# Patient Record
Sex: Male | Born: 1955 | Race: White | Hispanic: No | Smoking: Former smoker
Health system: Southern US, Community
[De-identification: ages and names within clinical notes are randomized; demographics above are authoritative.]

## PROBLEM LIST (undated history)

## (undated) DIAGNOSIS — J45909 Unspecified asthma, uncomplicated: Secondary | ICD-10-CM

## (undated) DIAGNOSIS — J449 Chronic obstructive pulmonary disease, unspecified: Secondary | ICD-10-CM

## (undated) DIAGNOSIS — I10 Essential (primary) hypertension: Secondary | ICD-10-CM

## (undated) DIAGNOSIS — E119 Type 2 diabetes mellitus without complications: Secondary | ICD-10-CM

## (undated) HISTORY — PX: KNEE SURGERY: SHX244

## (undated) HISTORY — DX: Essential (primary) hypertension: I10

## (undated) HISTORY — DX: Type 2 diabetes mellitus without complications: E11.9

## (undated) HISTORY — PX: CRANIOPLASTY: SHX1407

---

## 2018-05-11 ENCOUNTER — Encounter: Payer: Self-pay | Admitting: "Endocrinology

## 2018-07-12 ENCOUNTER — Ambulatory Visit: Payer: Self-pay | Admitting: "Endocrinology

## 2019-05-23 ENCOUNTER — Emergency Department (HOSPITAL_COMMUNITY)
Admission: EM | Admit: 2019-05-23 | Discharge: 2019-05-23 | Discharge status: Against Medical Advice | Payer: Medicaid - Out of State

## 2019-05-23 ENCOUNTER — Other Ambulatory Visit: Payer: Self-pay

## 2019-05-23 NOTE — ED Notes (Signed)
When patient came in to be triaged, he said "How much longer am I going to be here?." I stated "Well sir, I've got to find out more information about you. There will be a wait because we don't have any rooms available, but I'll get you triaged first." Stated, "I'm leaving. Have a good day."

## 2019-05-24 ENCOUNTER — Other Ambulatory Visit: Payer: Self-pay

## 2019-05-24 ENCOUNTER — Encounter (HOSPITAL_COMMUNITY): Payer: Self-pay | Admitting: Emergency Medicine

## 2019-05-24 ENCOUNTER — Emergency Department (HOSPITAL_COMMUNITY): Payer: Medicaid - Out of State

## 2019-05-24 ENCOUNTER — Emergency Department (HOSPITAL_COMMUNITY)
Admission: EM | Admit: 2019-05-24 | Discharge: 2019-05-24 | Disposition: A | Discharge status: Home / Self Care | Payer: Medicaid - Out of State | Attending: Emergency Medicine | Admitting: Emergency Medicine

## 2019-05-24 DIAGNOSIS — J4521 Mild intermittent asthma with (acute) exacerbation: Secondary | ICD-10-CM | POA: Diagnosis not present

## 2019-05-24 DIAGNOSIS — Z87891 Personal history of nicotine dependence: Secondary | ICD-10-CM | POA: Diagnosis not present

## 2019-05-24 DIAGNOSIS — Z760 Encounter for issue of repeat prescription: Secondary | ICD-10-CM | POA: Insufficient documentation

## 2019-05-24 DIAGNOSIS — R0602 Shortness of breath: Secondary | ICD-10-CM | POA: Diagnosis present

## 2019-05-24 HISTORY — DX: Unspecified asthma, uncomplicated: J45.909

## 2019-05-24 MED ORDER — PREDNISONE 50 MG PO TABS: ORAL_TABLET | ORAL | 0 refills | Status: DC

## 2019-05-24 MED ORDER — LISINOPRIL 10 MG PO TABS: 10.0000 mg | ORAL_TABLET | Freq: Every day | ORAL | 0 refills | Status: AC

## 2019-05-24 MED ORDER — AZITHROMYCIN 250 MG PO TABS: 250.0000 mg | ORAL_TABLET | Freq: Every day | ORAL | 0 refills | Status: DC

## 2019-05-24 NOTE — ED Triage Notes (Signed)
Patient states he woke up this am wheezing.  He was started on Prednisone 3 weeks ago and felt he had improvement.  States he is out of the prednisone.  Has not used inhaler this morning.

## 2019-05-24 NOTE — Discharge Instructions (Addendum)
Your chest xray suggests you may have an early lung infection and are being placed on antibiotics - take the entire course as instructed.  Also complete your 7 day course of the prednisone. You have been prescribed some lisinopril tablets so you do not run out before being able to get in with your MD for further refills.  Call for an appointment for this.  You should also get rechecked for any worsened breathing symptoms.

## 2019-05-24 NOTE — ED Provider Notes (Signed)
[facility] EMERGENCY DEPARTMENT Provider Note   CSN: [id number] Arrival date & time: 05/24/19  1012     History No chief complaint on file.   Julian Richards is a 63 y.o. male with a history of asthma, reporting increased wheezing and shortness of breath since yesterday.  His symptoms are not improved with his albuterol mdi or the neb tx he has, last dose taken yesterday.  He reports was on a course of prednisone last month which helped him greatly.  He has had a dry cough, denies chest pain, also no fevers, chills, swelling or pain in his legs.  His pcp is in [place] - states has placed him on advair which patient refuses to take, stating "I can't inhale a powder". Denies fevers, covid tested last month and negative. No known exposures.  Denies orthopnea. He reports living in an apartment and the man below him smokes, smells it through the air vents which triggers his asthma.   HPI     Past Medical History:  Diagnosis Date  . Asthma     There are no problems to display for this patient.   History reviewed. No pertinent surgical history.     No family history on file.  Social History   Tobacco Use  . Smoking status: Former Games developer  . Smokeless tobacco: Never Used  Substance Use Topics  . Alcohol use: Never  . Drug use: Never    Home Medications Prior to Admission medications   Medication Sig Start Date End Date Taking? Authorizing Provider  azithromycin (ZITHROMAX) 250 MG tablet Take 1 tablet (250 mg total) by mouth daily. Take first 2 tablets together, then 1 every day until finished. 05/24/19   Burgess Amor, PA-C  lisinopril (ZESTRIL) 10 MG tablet Take 1 tablet (10 mg total) by mouth daily. 05/24/19   Burgess Amor, PA-C  predniSONE (DELTASONE) 50 MG tablet Take one tablet daily for 7 days 05/24/19   Burgess Amor, PA-C    Allergies    Keflex [cephalexin]  Review of Systems   Review of Systems  Constitutional: Negative for chills and fever.  HENT: Negative for  congestion and sore throat.   Eyes: Negative.   Respiratory: Positive for shortness of breath and wheezing. Negative for chest tightness.   Cardiovascular: Negative for chest pain, palpitations and leg swelling.  Gastrointestinal: Negative for abdominal pain and nausea.  Genitourinary: Negative.   Musculoskeletal: Negative for arthralgias, joint swelling and neck pain.  Skin: Negative.  Negative for rash and wound.  Neurological: Negative for dizziness, weakness, light-headedness, numbness and headaches.  Psychiatric/Behavioral: Negative.     Physical Exam Updated Vital Signs BP (!) 158/83 (BP Location: Right Arm)   Pulse 76   Temp 97.9 F (36.6 C) (Oral)   Resp 18   Ht 5\' 9"  (1.753 m)   Wt 129.3 kg   SpO2 98%   BMI 42.09 kg/m   Physical Exam Vitals and nursing note reviewed.  Constitutional:      Appearance: He is well-developed.  HENT:     Head: Normocephalic and atraumatic.  Eyes:     Conjunctiva/sclera: Conjunctivae normal.  Cardiovascular:     Rate and Rhythm: Normal rate and regular rhythm.     Heart sounds: Normal heart sounds.  Pulmonary:     Effort: Pulmonary effort is normal.     Breath sounds: Examination of the right-lower field reveals decreased breath sounds and rhonchi. Decreased breath sounds and rhonchi present. No wheezing or rales.  Comments: Decreased breath sounds right base only.  No prolonged expirations. Abdominal:     General: Bowel sounds are normal.     Palpations: Abdomen is soft.     Tenderness: There is no abdominal tenderness.  Musculoskeletal:        General: Normal range of motion.     Cervical back: Normal range of motion.  Skin:    General: Skin is warm and dry.  Neurological:     Mental Status: He is alert.     ED Results / Procedures / Treatments   Labs (all labs ordered are listed, but only abnormal results are displayed) Labs Reviewed - No data to display  EKG None  Radiology DG Chest 2 View  Result Date:  05/24/2019 CLINICAL DATA:  Shortness of breath. EXAM: CHEST - 2 VIEW COMPARISON:  05/23/2019. FINDINGS: Mediastinum and hilar structures normal. Mild bibasilar atelectasis/infiltrates. No pleural effusion or pneumothorax. Cardiomegaly with normal pulmonary vascularity. Degenerative change thoracic spine. IMPRESSION: Mild bibasilar atelectasis/infiltrates. Electronically Signed   By: Marcello Moores  Register   On: 05/24/2019 12:35    Procedures Procedures (including critical care time)  Medications Ordered in ED Medications - No data to display  ED Course  I have reviewed the triage vital signs and the nursing notes.  Pertinent labs & imaging results that were available during my care of the patient were reviewed by me and considered in my medical decision making (see chart for details).    MDM Rules/Calculators/A&P                      Pt with c/o intermittent wheezing and sob, not currently here. cxr reviwed and interpreted, suggestion of infiltrates, will cover with zithromax along with 7 day prednisone pulse dosing.  Discussed the role of advair which can help prevent these episodes, make him less dependent on the nebs and mdi albuterol.  Pt was not aware this was an inhaled steroid, agrees to try it again.  Advised to start it up AFTER he has completed the prednisone course.    Pt also expressed need for refill of his lisinopril.  Has 3 days left.  Pharmacy won't fill and has been unable to get his pcp to respond for refills.  Provided #20 tab script.  Advised he needs to f/u with pcp to get further bp meds. Pt understands plan.   The patient appears reasonably screened and/or stabilized for discharge and I doubt any other medical condition or other [facility] requiring further screening, evaluation, or treatment in the ED at this time prior to discharge.  Final Clinical Impression(s) / ED Diagnoses Final diagnoses:  Mild intermittent asthma with exacerbation  Medication refill    Rx / DC  Orders ED Discharge Orders         Ordered    predniSONE (DELTASONE) 50 MG tablet     05/24/19 1305    azithromycin (ZITHROMAX) 250 MG tablet  Daily     05/24/19 1305    lisinopril (ZESTRIL) 10 MG tablet  Daily     05/24/19 1305           Evalee Jefferson, PA-C 05/24/19 1404    Noemi [place], MD 05/25/19 [phone]

## 2019-05-25 ENCOUNTER — Encounter (HOSPITAL_COMMUNITY): Payer: Self-pay

## 2019-05-25 ENCOUNTER — Other Ambulatory Visit: Payer: Self-pay

## 2019-05-25 ENCOUNTER — Emergency Department (HOSPITAL_COMMUNITY)
Admission: EM | Admit: 2019-05-25 | Discharge: 2019-05-25 | Disposition: A | Discharge status: Home / Self Care | Payer: Medicaid - Out of State | Attending: Emergency Medicine | Admitting: Emergency Medicine

## 2019-05-25 DIAGNOSIS — J45901 Unspecified asthma with (acute) exacerbation: Secondary | ICD-10-CM | POA: Insufficient documentation

## 2019-05-25 DIAGNOSIS — Z87891 Personal history of nicotine dependence: Secondary | ICD-10-CM | POA: Diagnosis not present

## 2019-05-25 DIAGNOSIS — R079 Chest pain, unspecified: Secondary | ICD-10-CM | POA: Diagnosis present

## 2019-05-25 NOTE — ED Provider Notes (Signed)
[facility] EMERGENCY DEPARTMENT Provider Note   CSN: [id number] Arrival date & time: 05/25/19  2251     History Chief Complaint  Patient presents with  . Recheck chest    Julian Richards is a 63 y.o. male.  HPI Patient presents for symptoms similar to when diagnosed with asthma exacerbation, yesterday.  He is currently taking Zithromax and prednisone.  Is also using albuterol inhaler.  He denies fever, chills, nausea, vomiting weakness or dizziness.  He has some mild left anterior chest pain.  He does not have productive cough.  He denies generalized achiness.  There are no other known modifying factors.    Past Medical History:  Diagnosis Date  . Asthma     There are no problems to display for this patient.   History reviewed. No pertinent surgical history.     No family history on file.  Social History   Tobacco Use  . Smoking status: Former Research scientist (life sciences)  . Smokeless tobacco: Never Used  Substance Use Topics  . Alcohol use: Never  . Drug use: Never    Home Medications Prior to Admission medications   Medication Sig Start Date End Date Taking? Authorizing Provider  azithromycin (ZITHROMAX) 250 MG tablet Take 1 tablet (250 mg total) by mouth daily. Take first 2 tablets together, then 1 every day until finished. 05/24/19   Evalee Jefferson, PA-C  lisinopril (ZESTRIL) 10 MG tablet Take 1 tablet (10 mg total) by mouth daily. 05/24/19   Evalee Jefferson, PA-C  predniSONE (DELTASONE) 50 MG tablet Take one tablet daily for 7 days 05/24/19   Evalee Jefferson, PA-C    Allergies    Keflex [cephalexin]  Review of Systems   Review of Systems  All other systems reviewed and are negative.   Physical Exam Updated Vital Signs BP (!) 152/81 (BP Location: Right Arm)   Pulse 85   Resp 17   SpO2 99%   Physical Exam Vitals and nursing note reviewed.  Constitutional:      General: He is not in acute distress.    Appearance: He is well-developed. He is not ill-appearing, toxic-appearing  or diaphoretic.  HENT:     Head: Normocephalic and atraumatic.     Right Ear: External ear normal.     Left Ear: External ear normal.  Eyes:     Conjunctiva/sclera: Conjunctivae normal.     Pupils: Pupils are equal, round, and reactive to light.  Neck:     Trachea: Phonation normal.  Cardiovascular:     Rate and Rhythm: Normal rate and regular rhythm.     Heart sounds: Normal heart sounds.  Pulmonary:     Effort: Pulmonary effort is normal. No respiratory distress.     Breath sounds: Normal breath sounds. No stridor. No rhonchi.  Musculoskeletal:        General: Normal range of motion.     Cervical back: Normal range of motion and neck supple.  Skin:    General: Skin is warm and dry.  Neurological:     Mental Status: He is alert and oriented to person, place, and time.     Cranial Nerves: No cranial nerve deficit.     Sensory: No sensory deficit.     Motor: No abnormal muscle tone.     Coordination: Coordination normal.  Psychiatric:        Mood and Affect: Mood normal.        Behavior: Behavior normal.        Thought Content: Thought  content normal.        Judgment: Judgment normal.     ED Results / Procedures / Treatments   Labs (all labs ordered are listed, but only abnormal results are displayed) Labs Reviewed - No data to display  EKG None  Radiology DG Chest 2 View  Result Date: 05/24/2019 CLINICAL DATA:  Shortness of breath. EXAM: CHEST - 2 VIEW COMPARISON:  05/23/2019. FINDINGS: Mediastinum and hilar structures normal. Mild bibasilar atelectasis/infiltrates. No pleural effusion or pneumothorax. Cardiomegaly with normal pulmonary vascularity. Degenerative change thoracic spine. IMPRESSION: Mild bibasilar atelectasis/infiltrates. Electronically Signed   By: Maisie Fus  Register   On: 05/24/2019 12:35    Procedures Procedures (including critical care time)  Medications Ordered in ED Medications - No data to display  ED Course  I have reviewed the triage vital  signs and the nursing notes.  Pertinent labs & imaging results that were available during my care of the patient were reviewed by me and considered in my medical decision making (see chart for details).    MDM Rules/Calculators/A&P                       Patient Vitals for the past 24 hrs:  BP Pulse Resp SpO2  05/25/19 2304 (!) 152/81 85 17 99 %    11:19 PM Reevaluation with update and discussion. After initial assessment and treatment, an updated evaluation reveals no change in clinical status, findings discussed with patient and all questions answered. Mancel Bale   Medical Decision Making: Evaluation consistent with mild to moderate asthma exacerbation.  Possible infection, nonspecific infiltrates on imaging from yesterday noted.  I reviewed the films.  Today vital signs are normal, with the exception of mild blood pressure elevation..  Note that the patient refused to have his temperature taken.  Doubt serious bacterial infection, impending respiratory decline or metabolic instability.  IDEN STRIPLING was evaluated in Emergency Department on 05/25/2019 for the symptoms described in the history of present illness. He was evaluated in the context of the global COVID-19 pandemic, which necessitated consideration that the patient might be at risk for infection with the SARS-CoV-2 virus that causes COVID-19. Institutional protocols and algorithms that pertain to the evaluation of patients at risk for COVID-19 are in a state of rapid change based on information released by regulatory bodies including the CDC and federal and state organizations. These policies and algorithms were followed during the patient's care in the ED.   CRITICAL CARE- no Performed by: Mancel Bale   Nursing Notes Reviewed/ Care Coordinated Applicable Imaging Reviewed Interpretation of Laboratory Data incorporated into ED treatment  The patient appears reasonably screened and/or stabilized for discharge and I doubt  any other medical condition or other [facility] requiring further screening, evaluation, or treatment in the ED at this time prior to discharge.  Plan: Home Medications-continue current prescribed medicine use Tylenol or Motrin for fever or pain; Home Treatments-rest, fluids; return here if the recommended treatment, does not improve the symptoms; Recommended follow up-PCP, prn    Final Clinical Impression(s) / ED Diagnoses Final diagnoses:  Moderate asthma with exacerbation, unspecified whether persistent    Rx / DC Orders ED Discharge Orders    None       Mancel Bale, MD 05/25/19 2321

## 2019-05-25 NOTE — Discharge Instructions (Addendum)
Continue taking the prescribed medications.

## 2019-05-25 NOTE — ED Triage Notes (Signed)
Pt was seen here a couple of days ago for some discomfort in his sternum, states was started on antibiotics for same but does not feel any better, has only taken 2 of the 6 pills so far.

## 2019-05-28 ENCOUNTER — Encounter (HOSPITAL_COMMUNITY): Payer: Self-pay

## 2019-05-28 ENCOUNTER — Emergency Department (HOSPITAL_COMMUNITY)
Admission: EM | Admit: 2019-05-28 | Discharge: 2019-05-28 | Disposition: A | Discharge status: Home / Self Care | Payer: Medicaid - Out of State | Attending: Emergency Medicine | Admitting: Emergency Medicine

## 2019-05-28 ENCOUNTER — Other Ambulatory Visit: Payer: Self-pay

## 2019-05-28 ENCOUNTER — Emergency Department (HOSPITAL_COMMUNITY): Payer: Medicaid - Out of State

## 2019-05-28 DIAGNOSIS — Z5321 Procedure and treatment not carried out due to patient leaving prior to being seen by health care provider: Secondary | ICD-10-CM

## 2019-05-28 DIAGNOSIS — R0602 Shortness of breath: Secondary | ICD-10-CM | POA: Diagnosis present

## 2019-05-28 NOTE — ED Triage Notes (Signed)
Pt presents to ED with complaints of increased SOB since this morning. Pt states he was seen here last week with the same thing. Pt states he completed his antibiotics and steroids. Pt denies fever.

## 2019-05-29 ENCOUNTER — Other Ambulatory Visit: Payer: Self-pay

## 2019-05-29 ENCOUNTER — Emergency Department (HOSPITAL_COMMUNITY): Payer: Medicaid - Out of State

## 2019-05-29 ENCOUNTER — Emergency Department (HOSPITAL_COMMUNITY)
Admission: EM | Admit: 2019-05-29 | Discharge: 2019-05-30 | Disposition: A | Discharge status: Home / Self Care | Payer: Medicaid - Out of State | Attending: Emergency Medicine | Admitting: Emergency Medicine

## 2019-05-29 ENCOUNTER — Encounter (HOSPITAL_COMMUNITY): Payer: Self-pay | Admitting: Emergency Medicine

## 2019-05-29 DIAGNOSIS — Z87891 Personal history of nicotine dependence: Secondary | ICD-10-CM | POA: Insufficient documentation

## 2019-05-29 DIAGNOSIS — R0602 Shortness of breath: Secondary | ICD-10-CM | POA: Diagnosis present

## 2019-05-29 DIAGNOSIS — Z7984 Long term (current) use of oral hypoglycemic drugs: Secondary | ICD-10-CM | POA: Diagnosis not present

## 2019-05-29 DIAGNOSIS — Z79899 Other long term (current) drug therapy: Secondary | ICD-10-CM | POA: Insufficient documentation

## 2019-05-29 DIAGNOSIS — E119 Type 2 diabetes mellitus without complications: Secondary | ICD-10-CM | POA: Insufficient documentation

## 2019-05-29 DIAGNOSIS — J4521 Mild intermittent asthma with (acute) exacerbation: Secondary | ICD-10-CM | POA: Insufficient documentation

## 2019-05-29 LAB — CBG MONITORING, ED
Glucose-Capillary: 163 mg/dL — ABNORMAL HIGH (ref 70–99)
Glucose-Capillary: 201 mg/dL — ABNORMAL HIGH (ref 70–99)

## 2019-05-29 MED ORDER — ALBUTEROL SULFATE (2.5 MG/3ML) 0.083% IN NEBU: 5.0000 mg | INHALATION_SOLUTION | Freq: Once | RESPIRATORY_TRACT | Status: DC

## 2019-05-29 MED ORDER — BUDESONIDE-FORMOTEROL FUMARATE 80-4.5 MCG/ACT IN AERO: 2.0000 | INHALATION_SPRAY | Freq: Two times a day (BID) | RESPIRATORY_TRACT | 0 refills | Status: DC

## 2019-05-29 MED ORDER — SITAGLIPTIN PHOSPHATE 100 MG PO TABS: 100.0000 mg | ORAL_TABLET | Freq: Every day | ORAL | 0 refills | Status: AC

## 2019-05-29 MED ORDER — PREDNISONE 10 MG PO TABS: ORAL_TABLET | ORAL | 0 refills | Status: DC

## 2019-05-29 NOTE — ED Provider Notes (Signed)
[facility] EMERGENCY DEPARTMENT Provider Note   CSN: [id number] Arrival date & time: 05/29/19  2039   History Chief Complaint  Patient presents with  . Shortness of Breath    Julian Richards is a 63 y.o. male.  The history is provided by the patient.  Shortness of Breath He has history of asthma and was recently seen in emergency and prescribed azithromycin and prednisone and comes in stating that he has run out of those and he wants a refill.  He did have some slight dyspnea earlier today, but is not having any dyspnea currently.  He denies cough or fever.  He denies change in sense of smell or taste.  He is also requesting a refill on his prescription for Januvia stating that the pharmacy will not refill it and he has had difficulty getting his PCP to authorize refills.  Past Medical History:  Diagnosis Date  . Asthma     There are no problems to display for this patient.   History reviewed. No pertinent surgical history.     No family history on file.  Social History   Tobacco Use  . Smoking status: Former Research scientist (life sciences)  . Smokeless tobacco: Never Used  Substance Use Topics  . Alcohol use: Never  . Drug use: Never    Home Medications Prior to Admission medications   Medication Sig Start Date End Date Taking? Authorizing Provider  azithromycin (ZITHROMAX) 250 MG tablet Take 1 tablet (250 mg total) by mouth daily. Take first 2 tablets together, then 1 every day until finished. 05/24/19   Evalee Jefferson, PA-C  lisinopril (ZESTRIL) 10 MG tablet Take 1 tablet (10 mg total) by mouth daily. 05/24/19   Evalee Jefferson, PA-C  predniSONE (DELTASONE) 50 MG tablet Take one tablet daily for 7 days 05/24/19   Evalee Jefferson, PA-C    Allergies    Keflex [cephalexin]  Review of Systems   Review of Systems  Respiratory: Positive for shortness of breath.   All other systems reviewed and are negative.   Physical Exam Updated Vital Signs BP 114/64 (BP Location: Right Arm)   Pulse 91    Temp 98.1 F (36.7 C) (Oral)   Ht 5\' 9"  (1.753 m)   Wt 129.3 kg   SpO2 98%   BMI 42.09 kg/m   Physical Exam Vitals and nursing note reviewed.   Obese 63 year old male, resting comfortably and in no acute distress. Vital signs are normal. Oxygen saturation is 98%, which is normal. Head is normocephalic and atraumatic. PERRLA, EOMI. Oropharynx is clear. Neck is nontender and supple without adenopathy or JVD. Back is nontender and there is no CVA tenderness. Lungs are clear without rales, wheezes, or rhonchi. Chest is nontender. Heart has regular rate and rhythm without murmur. Abdomen is soft, flat, nontender without masses or hepatosplenomegaly and peristalsis is normoactive. Extremities have 1+ edema, full range of motion is present. Skin is warm and dry without rash. Neurologic: Mental status is normal, cranial nerves are intact, there are no motor or sensory deficits.  ED Results / Procedures / Treatments   Labs (all labs ordered are listed, but only abnormal results are displayed) Labs Reviewed  CBG MONITORING, ED - Abnormal; Notable for the following components:      Result Value   Glucose-Capillary 201 (*)    All other components within normal limits  CBG MONITORING, ED   Radiology DG Chest 2 View  Result Date: 05/28/2019 CLINICAL DATA:  Shortness of breath. EXAM: CHEST -  2 VIEW COMPARISON:  May 24, 2019 FINDINGS: The mediastinal contour and cardiac silhouette are normal. Mild atelectasis of both lung bases are noted. There is no focal pneumonia, pulmonary edema, or pleural effusion. The bony structures are normal. IMPRESSION: Mild atelectasis of both lung bases. No focal pneumonia or pulmonary edema. Electronically Signed   By: Sherian Rein M.D.   On: 05/28/2019 12:23    Procedures Procedures  Medications Ordered in ED Medications  albuterol (PROVENTIL) (2.5 MG/3ML) 0.083% nebulizer solution 5 mg (5 mg Nebulization Not Given 05/29/19 2224)    ED Course  I  have reviewed the triage vital signs and the nursing notes.  Pertinent labs & imaging results that were available during my care of the patient were reviewed by me and considered in my medical decision making (see chart for details).    MDM Rules/Calculators/A&P Asthma, diabetes.  Old records reviewed confirming recent ED visit for asthma exacerbation.  He also had been seen at [facility] emergency department at which location he stated that he had been prescribed Advair but does not like to use it because he has difficulty inhaling a powder.  His glucose is to significantly elevated.  Chest x-ray shows no evidence of pneumonia.  He is given a 1 month supply of his Januvia and I will put him on a relatively short steroid taper.  He is given a prescription for Symbicort which is an inhaled aerosol steroid and long-acting beta agonist.  I do not see an indication for refilling the antibiotics.  He is referred back to his PCP.  Julian Richards was evaluated in Emergency Department on 05/29/2019 for the symptoms described in the history of present illness. He was evaluated in the context of the global COVID-19 pandemic, which necessitated consideration that the patient might be at risk for infection with the SARS-CoV-2 virus that causes COVID-19. Institutional protocols and algorithms that pertain to the evaluation of patients at risk for COVID-19 are in a state of rapid change based on information released by regulatory bodies including the CDC and federal and state organizations. These policies and algorithms were followed during the patient's care in the ED.  Final Clinical Impression(s) / ED Diagnoses Final diagnoses:  Mild intermittent asthma with exacerbation  Controlled type 2 diabetes mellitus without complication, without long-term current use of insulin (HCC)    Rx / DC Orders ED Discharge Orders         Ordered    sitaGLIPtin (JANUVIA) 100 MG tablet  Daily     05/29/19 2331     budesonide-formoterol (SYMBICORT) 80-4.5 MCG/ACT inhaler  2 times daily     05/29/19 2336    predniSONE (DELTASONE) 10 MG tablet     05/29/19 2342           Dione Booze, MD 05/30/19 0006

## 2019-05-29 NOTE — Discharge Instructions (Addendum)
You have been given a prescription for Symbicort which is an inhaled steroid plus a long-acting bronchodilator.  Please use this as directed.  You have been given a 1 month prescription for Januvia.  You will need to see your primary care provider to get additional prescriptions for this.  Continue to use your albuterol inhaler as needed.  Take the prednisone exactly as prescribed.  You will be taking a gradually decreasing dose over the next week..  Please monitor your blood sugars at home.

## 2019-05-29 NOTE — ED Triage Notes (Signed)
Pt c/o sob and dizziness that started today. Pt states he has been out of his meds x one week.

## 2019-05-31 ENCOUNTER — Other Ambulatory Visit: Payer: Self-pay

## 2019-05-31 ENCOUNTER — Encounter (HOSPITAL_COMMUNITY): Payer: Self-pay | Admitting: Emergency Medicine

## 2019-05-31 ENCOUNTER — Emergency Department (HOSPITAL_COMMUNITY)
Admission: EM | Admit: 2019-05-31 | Discharge: 2019-05-31 | Disposition: A | Discharge status: Home / Self Care | Payer: Medicaid - Out of State | Attending: Emergency Medicine | Admitting: Emergency Medicine

## 2019-05-31 DIAGNOSIS — R42 Dizziness and giddiness: Secondary | ICD-10-CM | POA: Insufficient documentation

## 2019-05-31 NOTE — ED Triage Notes (Signed)
Patient left without being seen after Triage.

## 2019-05-31 NOTE — ED Triage Notes (Signed)
Patient states he still feels dizzy, was seen here 12/28 for same. Patient denies nausea or vomiting. Patient states he feels "like he is going to fall sometimes when he stands up."

## 2019-06-01 ENCOUNTER — Emergency Department (HOSPITAL_COMMUNITY)
Admission: EM | Admit: 2019-06-01 | Discharge: 2019-06-01 | Discharge status: Against Medical Advice | Payer: Medicaid - Out of State

## 2019-06-01 NOTE — ED Notes (Signed)
Patient left before triage. Patient left without being seen day before for same.

## 2019-06-05 NOTE — ED Provider Notes (Signed)
Addendum for ED visit 05/29/2019:  LOGON, UTTECH YI:[id number] 29-May-2019 21:44:33 [facility]-ED ROUTINE RECORD Normal sinus rhythm Cannot rule out Inferior infarct , age undetermined Abnormal ECG No significant change since last tracing Confirmed by Linwood Dibbles [id number]) on 05/31/2019 10:16:54 AM 38mm/s 59mm/mV 100Hz  9.0.4 12SL 241 HD CID: [id number] Referred by: BRIAN MILLER Confirmed By: 76 Vent. rate 91 BPM PR interval 136 ms QRS duration 102 ms QT/QTc 384/472 ms P-R-T axes 60 34 -36 05-17-1956 (63 yr) Male Caucasian Room: Loc:906 Technician: kab,rn Test ind:sob   20-Feb-1956, MD 06/05/19 2315

## 2019-06-09 ENCOUNTER — Other Ambulatory Visit: Payer: Medicaid - Out of State

## 2019-06-25 ENCOUNTER — Encounter (HOSPITAL_COMMUNITY): Payer: Self-pay | Admitting: *Deleted

## 2019-06-25 ENCOUNTER — Other Ambulatory Visit: Payer: Self-pay

## 2019-06-25 ENCOUNTER — Emergency Department (HOSPITAL_COMMUNITY)
Admission: EM | Admit: 2019-06-25 | Discharge: 2019-06-25 | Disposition: A | Discharge status: Home / Self Care | Payer: Medicaid - Out of State | Attending: Emergency Medicine | Admitting: Emergency Medicine

## 2019-06-25 DIAGNOSIS — J45909 Unspecified asthma, uncomplicated: Secondary | ICD-10-CM | POA: Insufficient documentation

## 2019-06-25 DIAGNOSIS — Z87891 Personal history of nicotine dependence: Secondary | ICD-10-CM | POA: Diagnosis not present

## 2019-06-25 DIAGNOSIS — R519 Headache, unspecified: Secondary | ICD-10-CM | POA: Diagnosis not present

## 2019-06-25 DIAGNOSIS — M79672 Pain in left foot: Secondary | ICD-10-CM | POA: Diagnosis not present

## 2019-06-25 DIAGNOSIS — J0111 Acute recurrent frontal sinusitis: Secondary | ICD-10-CM | POA: Diagnosis not present

## 2019-06-25 DIAGNOSIS — Z79899 Other long term (current) drug therapy: Secondary | ICD-10-CM | POA: Insufficient documentation

## 2019-06-25 DIAGNOSIS — R0981 Nasal congestion: Secondary | ICD-10-CM | POA: Diagnosis present

## 2019-06-25 MED ORDER — PREDNISONE 20 MG PO TABS: 40.0000 mg | ORAL_TABLET | Freq: Every day | ORAL | 0 refills | Status: DC

## 2019-06-25 MED ORDER — PREDNISONE 20 MG PO TABS
40.0000 mg | ORAL_TABLET | Freq: Once | ORAL | Status: AC
  Administered 2019-06-25: 23:00:00 40 mg via ORAL
  Filled 2019-06-25: qty 2

## 2019-06-25 NOTE — ED Triage Notes (Signed)
Pt with sinus infection 4 months ago, facial pain like before for past 2 days.  Gout pain in left foot.

## 2019-06-25 NOTE — ED Provider Notes (Signed)
[facility] EMERGENCY DEPARTMENT Provider Note   CSN: [id number] Arrival date & time: 06/25/19  2125     History Chief Complaint  Patient presents with  . Facial Pain    Julian Richards is a 64 y.o. male.  HPI      Julian Richards is a 64 y.o. male who presents to the Emergency Department complaining of nasal congestion, sinus pressure and pain.  Symptoms have been present for 2 days.  He reports history of frequent sinus infections with last episode approximately 4 months ago.  He states current symptoms feel similar to previous.  He describes throbbing pain and pressure across his forehead and nose.  Pain is worse with bending over.  He has not taken any medications for symptomatic relief.  He also complains of pain and swelling of the toes of his left foot.  Reports history of gout and has been taking colchicine without relief.  He describes throbbing pain to his toes that is nonradiating.  He denies fever, chills, trauma,  numbness of his toes or foot or redness. He also denies headache, neck pain or stiffness, visual changes or dizziness.    Past Medical History:  Diagnosis Date  . Asthma     There are no problems to display for this patient.   History reviewed. No pertinent surgical history.     Family History  Problem Relation Age of Onset  . Stroke Father   . Cancer Sister   . Stroke Other     Social History   Tobacco Use  . Smoking status: Former Research scientist (life sciences)  . Smokeless tobacco: Never Used  Substance Use Topics  . Alcohol use: Never  . Drug use: Never    Home Medications Prior to Admission medications   Medication Sig Start Date End Date Taking? Authorizing Provider  budesonide-formoterol (SYMBICORT) 80-4.5 MCG/ACT inhaler Inhale 2 puffs into the lungs 2 (two) times daily. 01/77/93   Delora Fuel, MD  lisinopril (ZESTRIL) 10 MG tablet Take 1 tablet (10 mg total) by mouth daily. 05/24/19   Evalee Jefferson, PA-C  predniSONE (DELTASONE) 10 MG tablet Four tablets a  day for two days, then three tablets a day for two days, then two tablets a day for two days, then onoe tablet a day for two days 90/30/09   Delora Fuel, MD  sitaGLIPtin (JANUVIA) 100 MG tablet Take 1 tablet (100 mg total) by mouth daily. 23/30/07   Delora Fuel, MD    Allergies    Keflex [cephalexin]  Review of Systems   Review of Systems  Constitutional: Negative for chills and fever.  HENT: Positive for sinus pressure and sinus pain. Negative for sore throat and trouble swallowing.   Respiratory: Negative for cough, chest tightness, shortness of breath and wheezing.   Cardiovascular: Negative for chest pain and leg swelling.  Gastrointestinal: Negative for nausea and vomiting.  Genitourinary: Negative for difficulty urinating and dysuria.  Musculoskeletal: Positive for arthralgias (left foot pain). Negative for joint swelling.  Skin: Negative for color change and wound.  Neurological: Negative for dizziness, weakness, numbness and headaches.    Physical Exam Updated Vital Signs BP (!) 151/66 (BP Location: Right Arm)   Pulse 90   Temp (!) 97.5 F (36.4 C) (Oral)   Resp 16   Ht 5\' 9"  (1.753 m)   Wt 124.7 kg   SpO2 98%   BMI 40.61 kg/m   Physical Exam Vitals and nursing note reviewed.  Constitutional:      Appearance: Normal  appearance.  HENT:     Head: Normocephalic.     Right Ear: Tympanic membrane and ear canal normal.     Left Ear: Tympanic membrane and ear canal normal.     Nose:     Right Turbinates: Swollen.     Left Turbinates: Swollen.     Right Sinus: Maxillary sinus tenderness present.     Left Sinus: Maxillary sinus tenderness present.     Mouth/Throat:     Mouth: Mucous membranes are moist.     Pharynx: Oropharynx is clear. Uvula midline.  Cardiovascular:     Rate and Rhythm: Normal rate and regular rhythm.     Pulses: Normal pulses.  Pulmonary:     Effort: Pulmonary effort is normal. No respiratory distress.     Breath sounds: Normal breath sounds.  No wheezing.  Chest:     Chest wall: No tenderness.  Musculoskeletal:        General: Tenderness present. No signs of injury. Normal range of motion.     Cervical back: Normal range of motion. No rigidity or tenderness.     Comments: Pt with ttp of the second and great toe of the left foot.  Mild erythema noted on exam without edema.  No open wounds/sores to the foot or web spaces of the toes  Lymphadenopathy:     Cervical: No cervical adenopathy.  Skin:    General: Skin is warm.     Capillary Refill: Capillary refill takes less than 2 seconds.     Findings: No bruising, erythema or rash.  Neurological:     General: No focal deficit present.     Mental Status: He is alert.     Sensory: Sensation is intact. No sensory deficit.     Motor: Motor function is intact. No weakness.     Coordination: Coordination is intact.     Comments: CN II-XII grossly intact.  Speech clear.       ED Results / Procedures / Treatments   Labs (all labs ordered are listed, but only abnormal results are displayed) Labs Reviewed - No data to display  EKG None  Radiology No results found.  Procedures Procedures (including critical care time)  Medications Ordered in ED Medications  predniSONE (DELTASONE) tablet 40 mg (has no administration in time range)    ED Course  I have reviewed the triage vital signs and the nursing notes.  Pertinent labs & imaging results that were available during my care of the patient were reviewed by me and considered in my medical decision making (see chart for details).    MDM Rules/Calculators/A&P                      Pt with pain to toes of the left foot.  Mild erythema noted to second and great toes.  No concerning sx's for cellulitis.  Complains of nasal congestion with sinus pressure and pain x 2 days.  No focal neuro deficits, no meningeal signs.  bilateral turbinates are edematous.    Rx for short course of steroids.  He agrees to monitor blood sugar and  d/c prednisone if blood sugars become elevated.    Final Clinical Impression(s) / ED Diagnoses Final diagnoses:  Acute recurrent frontal sinusitis  Left foot pain    Rx / DC Orders ED Discharge Orders    None       Pauline Aus, PA-C 06/25/19 2248    Cathren Laine, MD 06/26/19 1301

## 2019-06-25 NOTE — Discharge Instructions (Addendum)
Take the medication as directed.  You will need to closely monitor your blood sugars while taking the prednisone as this may cause a temporary elevation of your blood sugar level.  If your blood sugars become too high, please stop taking the prednisone.  Call your primary doctor this week to arrange a follow-up appointment.

## 2019-07-01 ENCOUNTER — Emergency Department (HOSPITAL_COMMUNITY)
Admission: EM | Admit: 2019-07-01 | Discharge: 2019-07-01 | Disposition: A | Discharge status: Home / Self Care | Payer: Medicaid - Out of State | Attending: Emergency Medicine | Admitting: Emergency Medicine

## 2019-07-01 ENCOUNTER — Encounter (HOSPITAL_COMMUNITY): Payer: Self-pay | Admitting: Emergency Medicine

## 2019-07-01 ENCOUNTER — Other Ambulatory Visit: Payer: Self-pay

## 2019-07-01 ENCOUNTER — Emergency Department (HOSPITAL_COMMUNITY): Payer: Medicaid - Out of State

## 2019-07-01 DIAGNOSIS — J4521 Mild intermittent asthma with (acute) exacerbation: Secondary | ICD-10-CM | POA: Diagnosis not present

## 2019-07-01 DIAGNOSIS — R609 Edema, unspecified: Secondary | ICD-10-CM | POA: Insufficient documentation

## 2019-07-01 DIAGNOSIS — R062 Wheezing: Secondary | ICD-10-CM | POA: Diagnosis present

## 2019-07-01 DIAGNOSIS — Z87891 Personal history of nicotine dependence: Secondary | ICD-10-CM | POA: Insufficient documentation

## 2019-07-01 DIAGNOSIS — Z79899 Other long term (current) drug therapy: Secondary | ICD-10-CM | POA: Insufficient documentation

## 2019-07-01 DIAGNOSIS — J45901 Unspecified asthma with (acute) exacerbation: Secondary | ICD-10-CM

## 2019-07-01 MED ORDER — BUDESONIDE-FORMOTEROL FUMARATE 80-4.5 MCG/ACT IN AERO: 2.0000 | INHALATION_SPRAY | Freq: Two times a day (BID) | RESPIRATORY_TRACT | 0 refills | Status: AC

## 2019-07-01 MED ORDER — AEROCHAMBER Z-STAT PLUS/MEDIUM MISC
1.0000 | Freq: Once | Status: AC
  Administered 2019-07-01: 18:00:00 1
  Filled 2019-07-01: qty 1

## 2019-07-01 MED ORDER — ALBUTEROL SULFATE HFA 108 (90 BASE) MCG/ACT IN AERS
2.0000 | INHALATION_SPRAY | Freq: Once | RESPIRATORY_TRACT | Status: AC
  Administered 2019-07-01: 18:00:00 2 via RESPIRATORY_TRACT
  Filled 2019-07-01: qty 6.7

## 2019-07-01 NOTE — Discharge Instructions (Signed)
Start using the symbicort prescription given as instructed - this is a medication that takes the place of the advair that you did not like to take and may help keep your asthma under better control with less need of using your albuterol inhaler.  Your chest xray is clear and your exam is reassuring.  Get rechecked if you have any new problems or concerns.

## 2019-07-01 NOTE — ED Triage Notes (Signed)
Pt states he has been wheezing more because people came in his apartments and put glue down. Has not used nebulizer because it makes him shake. Used inhaler this morning with minimal relief.

## 2019-07-01 NOTE — ED Notes (Signed)
Pt ambulated to bathroom and back. O2 sats on RA was 98-99%.

## 2019-07-01 NOTE — ED Provider Notes (Signed)
[facility] EMERGENCY DEPARTMENT Provider Note   CSN: [id number] Arrival date & time: 07/01/19  1616     History Chief Complaint  Patient presents with  . Wheezing    Julian Richards is a 64 y.o. male with a history of asthma presenting with asthma exacerbation which started this morning.  He has been exposed to fumes in his apartment by glue that workmen had put down to repair an item.  Since then he has had worsening shortness of breath and wheezing.  He denies fevers or chills, he has had no problems with coughing, also denies chest pain, nausea, vomiting fevers or chills.  No exposures to Covid.  He has a nebulizer machine but did not use it this morning as it makes him pretty shaky.  He did use his albuterol MDI 2 puffs about 930 today without improvement in his symptoms.  He endorses bilateral lower leg edema which is chronic and not worsened today.  Denies history of CHF.  HPI     Past Medical History:  Diagnosis Date  . Asthma     There are no problems to display for this patient.   History reviewed. No pertinent surgical history.     Family History  Problem Relation Age of Onset  . Stroke Father   . Cancer Sister   . Stroke Other     Social History   Tobacco Use  . Smoking status: Former Games developer  . Smokeless tobacco: Never Used  Substance Use Topics  . Alcohol use: Never  . Drug use: Never    Home Medications Prior to Admission medications   Medication Sig Start Date End Date Taking? Authorizing Provider  budesonide-formoterol (SYMBICORT) 80-4.5 MCG/ACT inhaler Inhale 2 puffs into the lungs 2 (two) times daily. 07/01/19   Burgess Amor, PA-C  lisinopril (ZESTRIL) 10 MG tablet Take 1 tablet (10 mg total) by mouth daily. 05/24/19   Burgess Amor, PA-C  predniSONE (DELTASONE) 20 MG tablet Take 2 tablets (40 mg total) by mouth daily. 06/25/19   Triplett, Tammy, PA-C  sitaGLIPtin (JANUVIA) 100 MG tablet Take 1 tablet (100 mg total) by mouth daily. 05/29/19   Dione Booze, MD    Allergies    Keflex [cephalexin]  Review of Systems   Review of Systems  Constitutional: Negative for chills and fever.  HENT: Negative for congestion and sore throat.   Eyes: Negative.   Respiratory: Positive for shortness of breath and wheezing. Negative for chest tightness.   Cardiovascular: Positive for leg swelling. Negative for chest pain and palpitations.  Gastrointestinal: Negative for abdominal pain and nausea.  Genitourinary: Negative.   Musculoskeletal: Negative for arthralgias, joint swelling and neck pain.  Skin: Negative.  Negative for rash and wound.  Neurological: Negative for dizziness, weakness, light-headedness, numbness and headaches.  Psychiatric/Behavioral: Negative.     Physical Exam Updated Vital Signs BP 126/74 (BP Location: Right Arm)   Pulse 83   Temp 98.3 F (36.8 C) (Oral)   Resp 18   Ht 5\' 9"  (1.753 m)   Wt 124.7 kg   SpO2 96%   BMI 40.61 kg/m   Physical Exam Vitals and nursing note reviewed.  Constitutional:      Appearance: He is well-developed.  HENT:     Head: Normocephalic and atraumatic.  Eyes:     Conjunctiva/sclera: Conjunctivae normal.  Cardiovascular:     Rate and Rhythm: Normal rate and regular rhythm.     Heart sounds: Normal heart sounds.  Pulmonary:  Effort: Pulmonary effort is normal.     Breath sounds: Normal breath sounds. No wheezing.     Comments: Prolonged expirations in all lung fields, but no significant wheezing.  Adequate aeration.  No rhonchi or rales appreciated. Abdominal:     General: Bowel sounds are normal.     Palpations: Abdomen is soft.     Tenderness: There is no abdominal tenderness.  Musculoskeletal:        General: Normal range of motion.     Cervical back: Normal range of motion.     Right lower leg: Edema present.     Left lower leg: Edema present.  Skin:    General: Skin is warm and dry.  Neurological:     Mental Status: He is alert.     ED Results / Procedures /  Treatments   Labs (all labs ordered are listed, but only abnormal results are displayed) Labs Reviewed - No data to display  EKG None  Radiology DG Chest 2 View  Result Date: 07/01/2019 CLINICAL DATA:  Shortness of breath. EXAM: CHEST - 2 VIEW COMPARISON:  June 12, 2019 FINDINGS: A trace amount of atelectasis is seen within the bilateral lung bases. There is no evidence of acute infiltrate, pleural effusion or pneumothorax. The heart size and mediastinal contours are within normal limits. The visualized skeletal structures are unremarkable. IMPRESSION: No active cardiopulmonary disease. Electronically Signed   By: Virgina Norfolk M.D.   On: 07/01/2019 17:30    Procedures Procedures (including critical care time)  Medications Ordered in ED Medications  albuterol (VENTOLIN HFA) 108 (90 Base) MCG/ACT inhaler 2 puff (2 puffs Inhalation Given by Other 07/01/19 1808)  aerochamber Z-Stat Plus/medium 1 each (1 each Other Given by Other 07/01/19 1809)    ED Course  I have reviewed the triage vital signs and the nursing notes.  Pertinent labs & imaging results that were available during my care of the patient were reviewed by me and considered in my medical decision making (see chart for details).    MDM Rules/Calculators/A&P                      Patient with expected asthma exacerbation, no lung exam findings to suggest CHF.  05/29/2019 he was prescribed Symbicort in substitution for his Advair which she does not like to use as he has difficulty inhaling powdered medication.  He is not aware that this medication was prescribed to him and did not have this medication available at his pharmacy.  This was really prescribed for him today.  He was given 2 puffs of albuterol using a spacer while here and he endorses complete resolution of his shortness of breath symptoms and was symptom-free after this treatment. Pt ambulated in dept without wheezing, sob or hypoxia. Final Clinical Impression(s)  / ED Diagnoses Final diagnoses:  Mild asthma with exacerbation, unspecified whether persistent    Rx / DC Orders ED Discharge Orders         Ordered    budesonide-formoterol (SYMBICORT) 80-4.5 MCG/ACT inhaler  2 times daily     07/01/19 1842           Evalee Jefferson, PA-C 07/01/19 1845    Ezequiel Essex, MD 07/01/19 1900

## 2019-07-18 ENCOUNTER — Other Ambulatory Visit: Payer: Self-pay

## 2019-07-18 ENCOUNTER — Encounter (HOSPITAL_COMMUNITY): Payer: Self-pay

## 2019-07-18 ENCOUNTER — Emergency Department (HOSPITAL_COMMUNITY)
Admission: EM | Admit: 2019-07-18 | Discharge: 2019-07-18 | Disposition: A | Discharge status: Home / Self Care | Payer: Medicaid - Out of State | Attending: Emergency Medicine | Admitting: Emergency Medicine

## 2019-07-18 DIAGNOSIS — R0602 Shortness of breath: Secondary | ICD-10-CM | POA: Diagnosis not present

## 2019-07-18 DIAGNOSIS — Z5321 Procedure and treatment not carried out due to patient leaving prior to being seen by health care provider: Secondary | ICD-10-CM | POA: Diagnosis not present

## 2019-07-18 HISTORY — DX: Chronic obstructive pulmonary disease, unspecified: J44.9

## 2019-07-18 NOTE — ED Triage Notes (Signed)
Pt reports SOB that started today and has used his inhaler x 1 times

## 2019-07-20 ENCOUNTER — Other Ambulatory Visit: Payer: Self-pay

## 2019-07-20 ENCOUNTER — Encounter (HOSPITAL_COMMUNITY): Payer: Self-pay

## 2019-07-20 ENCOUNTER — Emergency Department (HOSPITAL_COMMUNITY)
Admission: EM | Admit: 2019-07-20 | Discharge: 2019-07-21 | Disposition: A | Discharge status: Home / Self Care | Payer: Medicaid - Out of State | Attending: Emergency Medicine | Admitting: Emergency Medicine

## 2019-07-20 ENCOUNTER — Emergency Department (HOSPITAL_COMMUNITY): Payer: Medicaid - Out of State

## 2019-07-20 DIAGNOSIS — R0602 Shortness of breath: Secondary | ICD-10-CM | POA: Diagnosis not present

## 2019-07-20 DIAGNOSIS — J45909 Unspecified asthma, uncomplicated: Secondary | ICD-10-CM | POA: Diagnosis not present

## 2019-07-20 DIAGNOSIS — Z87891 Personal history of nicotine dependence: Secondary | ICD-10-CM | POA: Insufficient documentation

## 2019-07-20 DIAGNOSIS — Z7984 Long term (current) use of oral hypoglycemic drugs: Secondary | ICD-10-CM | POA: Diagnosis not present

## 2019-07-20 DIAGNOSIS — J449 Chronic obstructive pulmonary disease, unspecified: Secondary | ICD-10-CM | POA: Diagnosis not present

## 2019-07-20 MED ORDER — ALBUTEROL SULFATE HFA 108 (90 BASE) MCG/ACT IN AERS: 2.0000 | INHALATION_SPRAY | Freq: Four times a day (QID) | RESPIRATORY_TRACT | 0 refills | Status: DC | PRN

## 2019-07-20 MED ORDER — ALBUTEROL SULFATE HFA 108 (90 BASE) MCG/ACT IN AERS
2.0000 | INHALATION_SPRAY | Freq: Once | RESPIRATORY_TRACT | Status: AC
  Administered 2019-07-20: 2 via RESPIRATORY_TRACT
  Filled 2019-07-20: qty 6.7

## 2019-07-20 NOTE — Discharge Instructions (Addendum)
It was a pleasure meeting you today. You chest xray did not show that you have an active infection going on. The most likely cause of your symptoms is your asthma. I have placed a referral to a pulmonologist (lung doctor) to assist you with managing these symptoms. If you do not hear from them by next Tuesday, please call them to arrange an appointment. I will also have social work call you in the morning and help you find a new primary care doctor. They will be able to write you a letter for your living situation.

## 2019-07-20 NOTE — ED Provider Notes (Signed)
[facility] EMERGENCY DEPARTMENT Provider Note   CSN: [id number] Arrival date & time: 07/20/19  2200     History Chief Complaint  Patient presents with  . Shortness of Breath    asthma    Julian Richards is a 64 y.o. male with past medical history of asthma who is presenting today for chronic shortness of breath characterized by chest tightness and wheezing. He notes his symptoms are worse at home and attributes this to a status neighbor who smokes cigarettes.  He denies any fever chills.  Denies cough.  Denies orthopnea. He notes that when he was last in the ER in the end of January, he was given a symbicort inhaler however only uses this "once in a while" and it only works for about 22min. No prior PFTs or apparent pulmonology evaluations. No current or prior history of cigarette smoking. No known sick contacts.    Past Medical History:  Diagnosis Date  . Asthma   . COPD (chronic obstructive pulmonary disease) (HCC)     There are no problems to display for this patient.   History reviewed. No pertinent surgical history.     Family History  Problem Relation Age of Onset  . Stroke Father   . Cancer Sister   . Stroke Other     Social History   Tobacco Use  . Smoking status: Former Research scientist (life sciences)  . Smokeless tobacco: Never Used  Substance Use Topics  . Alcohol use: Never  . Drug use: Never    Home Medications Prior to Admission medications   Medication Sig Start Date End Date Taking? Authorizing Provider  albuterol (VENTOLIN HFA) 108 (90 Base) MCG/ACT inhaler Inhale 2 puffs into the lungs every 6 (six) hours as needed for wheezing or shortness of breath. 07/20/19   Mitzi Hansen, MD  budesonide-formoterol (SYMBICORT) 80-4.5 MCG/ACT inhaler Inhale 2 puffs into the lungs 2 (two) times daily. 07/01/19   Evalee Jefferson, PA-C  lisinopril (ZESTRIL) 10 MG tablet Take 1 tablet (10 mg total) by mouth daily. 05/24/19   Evalee Jefferson, PA-C  predniSONE (DELTASONE) 20 MG tablet Take 2  tablets (40 mg total) by mouth daily. 06/25/19   Triplett, Tammy, PA-C  sitaGLIPtin (JANUVIA) 100 MG tablet Take 1 tablet (100 mg total) by mouth daily. 16/60/63   Delora Fuel, MD    Allergies    Keflex [cephalexin]  Review of Systems   Review of Systems  Constitutional: Negative for chills and fever.  HENT: Negative.   Respiratory: Positive for chest tightness and shortness of breath.   Cardiovascular: Positive for leg swelling. Negative for chest pain.  Gastrointestinal: Negative.   Genitourinary: Negative.   Musculoskeletal: Negative.   Neurological: Negative.   Psychiatric/Behavioral: Negative.     Physical Exam Updated Vital Signs BP 107/79 (BP Location: Right Arm)   Pulse 86   Temp 98.1 F (36.7 C) (Oral)   Resp 20   Ht 5\' 9"  (1.753 m)   Wt 124.7 kg   SpO2 96%   BMI 40.61 kg/m   Physical Exam Constitutional:      General: He is not in acute distress. HENT:     Head: Atraumatic.  Neck:     Vascular: No JVD.  Cardiovascular:     Rate and Rhythm: Normal rate and regular rhythm.  Pulmonary:     Effort: Pulmonary effort is normal. No accessory muscle usage or respiratory distress.     Breath sounds: Examination of the right-upper field reveals wheezing. Examination of the left-upper  field reveals wheezing. Wheezing present. No decreased breath sounds, rhonchi or rales.  Abdominal:     General: Bowel sounds are normal.     Palpations: Abdomen is soft.  Musculoskeletal:     Right lower leg: Edema present.     Left lower leg: Edema present.  Skin:    General: Skin is warm and dry.  Neurological:     General: No focal deficit present.     Mental Status: He is alert.  Psychiatric:        Mood and Affect: Mood normal.     ED Results / Procedures / Treatments   Labs (all labs ordered are listed, but only abnormal results are displayed) Labs Reviewed - No data to display  EKG None  Radiology DG Chest Portable 1 View  Result Date: 07/20/2019 CLINICAL  DATA:  Infiltrate. EXAM: PORTABLE CHEST 1 VIEW COMPARISON:  July 01, 2019 FINDINGS: The heart size and mediastinal contours are within normal limits. Both lungs are clear. The visualized skeletal structures are unremarkable. IMPRESSION: No active disease. Electronically Signed   By: Katherine Mantle M.D.   On: 07/20/2019 23:53    Medications Ordered in ED Medications  albuterol (VENTOLIN HFA) 108 (90 Base) MCG/ACT inhaler 2 puff (2 puffs Inhalation Given 07/20/19 2344)    ED Course  I have reviewed the triage vital signs and the nursing notes.  Pertinent labs & imaging results that were available during my care of the patient were reviewed by me and considered in my medical decision making (see chart for details).    MDM Rules/Calculators/A&P                      11:30 PM initial assessment: This is a 64 year old gentleman with past medical history of asthma (no prior PFTs for confirmation) who is presenting for chronic shortness of breath.  No s/s of infectious process.  Most likely related to his asthma exacerbations from environmental factors. On presentation today, he does not appear to be in respiratory distress and O2 sats are appropriate and is afebrile. Mild expiratory wheezes at his bilateral apices support this as well. Will obtain a CXR and give him a dose of albuterol.   12:07 AM CXR clear--no sign of infectious process. Stable for discharge. Will d/c with albuterol inhaler. Will place a consult to social work to assist him with finding a PCP as he stated he is not happy with his current one. Will also place referral to pulm for further evaluation and possible PFTs.   Final Clinical Impression(s) / ED Diagnoses Final diagnoses:  Shortness of breath    Rx / DC Orders ED Discharge Orders         Ordered    albuterol (VENTOLIN HFA) 108 (90 Base) MCG/ACT inhaler  Every 6 hours PRN     07/20/19 2332    Ambulatory referral to Pulmonology     07/20/19 2338             Elige Radon, MD 07/21/19 0011    Marily Memos, MD 07/21/19 0410

## 2019-07-20 NOTE — ED Triage Notes (Signed)
Pt reports asthma flare up that started about a week ago, pt says it has gotten worse since weather has gotten colder. Pt 99% on room air. NAD noted.

## 2019-07-22 ENCOUNTER — Encounter (HOSPITAL_COMMUNITY): Payer: Self-pay

## 2019-07-22 ENCOUNTER — Other Ambulatory Visit: Payer: Self-pay

## 2019-07-22 ENCOUNTER — Emergency Department (HOSPITAL_COMMUNITY)
Admission: EM | Admit: 2019-07-22 | Discharge: 2019-07-22 | Disposition: A | Discharge status: Home / Self Care | Payer: Medicaid - Out of State | Attending: Emergency Medicine | Admitting: Emergency Medicine

## 2019-07-22 DIAGNOSIS — R0602 Shortness of breath: Secondary | ICD-10-CM | POA: Diagnosis not present

## 2019-07-22 DIAGNOSIS — J45909 Unspecified asthma, uncomplicated: Secondary | ICD-10-CM | POA: Diagnosis not present

## 2019-07-22 DIAGNOSIS — J449 Chronic obstructive pulmonary disease, unspecified: Secondary | ICD-10-CM | POA: Insufficient documentation

## 2019-07-22 DIAGNOSIS — Z79899 Other long term (current) drug therapy: Secondary | ICD-10-CM | POA: Diagnosis not present

## 2019-07-22 DIAGNOSIS — Z87891 Personal history of nicotine dependence: Secondary | ICD-10-CM | POA: Diagnosis not present

## 2019-07-22 MED ORDER — ALBUTEROL SULFATE HFA 108 (90 BASE) MCG/ACT IN AERS
2.0000 | INHALATION_SPRAY | Freq: Once | RESPIRATORY_TRACT | Status: AC
  Administered 2019-07-22: 2 via RESPIRATORY_TRACT
  Filled 2019-07-22: qty 6.7

## 2019-07-22 MED ORDER — AEROCHAMBER Z-STAT PLUS/MEDIUM MISC
1.0000 | Freq: Once | Status: AC
  Administered 2019-07-22: 1
  Filled 2019-07-22: qty 1

## 2019-07-22 MED ORDER — IPRATROPIUM BROMIDE HFA 17 MCG/ACT IN AERS
2.0000 | INHALATION_SPRAY | Freq: Once | RESPIRATORY_TRACT | Status: DC
  Filled 2019-07-22: qty 12.9

## 2019-07-22 NOTE — ED Notes (Signed)
Pt report he has not followed because doctor is in [place]  "I'm here for the same thing"  Reports he does not want to wait long

## 2019-07-22 NOTE — ED Notes (Signed)
Pt seen walking past nurses' station. He was asked if he was leaving by another nurse and pt stated "yes I am" and kept on walking.

## 2019-07-22 NOTE — Discharge Instructions (Addendum)
Follow up with pulmonologist in [place]

## 2019-07-22 NOTE — ED Notes (Signed)
Pt called out and stated he wanted to leave. Notified PA seeing pt and was told pt could leave AMA. Go to pt's room and have him sign his AMA papers and pt states he is not signing anything. Pt then said "all he said he was gonna do was give me something for my breathing and then send me home" . He further stated that he would stay for the medications that were ordered and "if they work fine, if not; I'm leaving".

## 2019-07-22 NOTE — ED Triage Notes (Signed)
Pt seen here last night for SOB and asthma.. Here for the same. Pt talkative in triage and in no obvious distress.  Pt ambulatory to tx room

## 2019-07-22 NOTE — ED Notes (Signed)
Pt 97% after ambulating on room air. HR 80

## 2019-07-22 NOTE — ED Provider Notes (Signed)
[facility] EMERGENCY DEPARTMENT Provider Note   CSN: [id number] Arrival date & time: 07/22/19  2126     History Chief Complaint  Patient presents with  . Shortness of Breath    Julian Richards is a 64 y.o. male   HPI  Patient is a 64 year old gentleman with a history of chronic shortness of breath with past diagnosis of asthma presenting today with shortness of breath that he states has been ongoing for some time but worse over the past week.  He states that he was seen 2 days ago and had an x-ray done which "showed nothing ".  He was told to follow-up with pulmonology in [place] however he states that he has not been able to follow-up because it was the weekend.   Patient states that his shortness of breath is sometimes triggered by cold as well as cigarette smoking.  He states that he has had some wheezing and has been using albuterol inhalers every hour.  He states that he has been using his inhaled corticosteroid inhaler daily as prescribed as well as frequently using albuterol.  He denies any known Covid exposure states he has been tested frequently and does not will be tested today.  Patient denies any fevers, chills, chest pain, diaphoresis, nausea, vomiting.  Denies any abdominal pain as well.  He states he is otherwise feeling well has no pain anywhere.  He states he has no history of CHF and has chronic bilateral lower extremity edema which is unchanged today and denies any calf tenderness or history of blood clots.  He denies any warmth or swelling in his legs.     Past Medical History:  Diagnosis Date  . Asthma   . COPD (chronic obstructive pulmonary disease) (HCC)     There are no problems to display for this patient.   History reviewed. No pertinent surgical history.     Family History  Problem Relation Age of Onset  . Stroke Father   . Cancer Sister   . Stroke Other     Social History   Tobacco Use  . Smoking status: Former Games developer  . Smokeless  tobacco: Never Used  Substance Use Topics  . Alcohol use: Never  . Drug use: Never    Home Medications Prior to Admission medications   Medication Sig Start Date End Date Taking? Authorizing Provider  albuterol (VENTOLIN HFA) 108 (90 Base) MCG/ACT inhaler Inhale 2 puffs into the lungs every 6 (six) hours as needed for wheezing or shortness of breath. 07/20/19   Elige Radon, MD  budesonide-formoterol (SYMBICORT) 80-4.5 MCG/ACT inhaler Inhale 2 puffs into the lungs 2 (two) times daily. 07/01/19   Burgess Amor, PA-C  lisinopril (ZESTRIL) 10 MG tablet Take 1 tablet (10 mg total) by mouth daily. 05/24/19   Burgess Amor, PA-C  predniSONE (DELTASONE) 20 MG tablet Take 2 tablets (40 mg total) by mouth daily. 06/25/19   Triplett, Tammy, PA-C  sitaGLIPtin (JANUVIA) 100 MG tablet Take 1 tablet (100 mg total) by mouth daily. 05/29/19   Dione Booze, MD    Allergies    Keflex [cephalexin]  Review of Systems   Review of Systems  Constitutional: Negative for chills and fever.  HENT: Negative for congestion.   Eyes: Negative for pain.  Respiratory: Positive for shortness of breath. Negative for cough.   Cardiovascular: Negative for chest pain and leg swelling.  Gastrointestinal: Negative for abdominal pain and vomiting.  Genitourinary: Negative for dysuria.  Musculoskeletal: Negative for myalgias.  Skin: Negative  for rash.  Neurological: Negative for dizziness and headaches.    Physical Exam Updated Vital Signs BP (!) 174/79   Pulse 79   Temp 98.2 F (36.8 C)   Resp 12   Ht 5\' 9"  (1.753 m)   Wt 124.7 kg   SpO2 98%   BMI 40.60 kg/m   Physical Exam Vitals and nursing note reviewed.  Constitutional:      General: He is not in acute distress.    Appearance: He is obese. He is not ill-appearing, toxic-appearing or diaphoretic.  HENT:     Head: Normocephalic and atraumatic.     Nose: Nose normal.     Mouth/Throat:     Mouth: Mucous membranes are moist.  Eyes:     General: No  scleral icterus. Cardiovascular:     Rate and Rhythm: Normal rate and regular rhythm.     Pulses: Normal pulses.     Heart sounds: Normal heart sounds.  Pulmonary:     Effort: Pulmonary effort is normal. No respiratory distress.     Breath sounds: No wheezing.     Comments: Faint end expiratory wheeze.  No increased work of breathing, lungs are otherwise clear to auscultation bilaterally.  No rhonchi, Rales.  No chest wall tenderness.  Patient speaking in full, long sentences without difficulty.  Not tachypneic Abdominal:     Palpations: Abdomen is soft.     Tenderness: There is no abdominal tenderness. There is no guarding or rebound.     Comments: Obese abdomen  Musculoskeletal:     Cervical back: Normal range of motion and neck supple.     Right lower leg: Edema present.     Left lower leg: Edema present.     Comments: Significant stable lower extremity edema.  Skin:    General: Skin is warm and dry.     Capillary Refill: Capillary refill takes less than 2 seconds.  Neurological:     Mental Status: He is alert. Mental status is at baseline.  Psychiatric:        Mood and Affect: Mood normal.        Behavior: Behavior normal.     ED Results / Procedures / Treatments   Labs (all labs ordered are listed, but only abnormal results are displayed) Labs Reviewed - No data to display  EKG EKG Interpretation  Date/Time:  Saturday July 22 2019 21:43:42 EST Ventricular Rate:  78 PR Interval:    QRS Duration: 111 QT Interval:  381 QTC Calculation: 434 R Axis:   49 Text Interpretation: Sinus rhythm Low voltage, precordial leads Borderline T abnormalities, inferior leads Confirmed by Nat Christen [phone]) on 07/22/2019 10:02:49 PM   Radiology No results found.  Procedures Procedures (including critical care time)  Medications Ordered in ED Medications  albuterol (VENTOLIN HFA) 108 (90 Base) MCG/ACT inhaler 2 puff (2 puffs Inhalation Given 07/22/19 2244)  aerochamber Z-Stat  Plus/medium 1 each (1 each Other Given 07/22/19 2245)    ED Course  I have reviewed the triage vital signs and the nursing notes.  Pertinent labs & imaging results that were available during my care of the patient were reviewed by me and considered in my medical decision making (see chart for details).    MDM Rules/Calculators/A&P                      Patient with history of asthma/COPD/chronic shortness of breath presented today for same.  He has been evaluated 7 times in  the past 2 months and was discharged each time.  He has not followed up with pulmonology which she has been instructed to do.  Patient has very faint end expiratory wheezing but otherwise clear lungs did not believe that he needs any additional work-up today.  I will provide patient with 2 puffs of albuterol 2 puffs of Atrovent and reassess.  I expect discharge patient with firm recommendation to follow-up with pulmonology.  I specifically doubt pneumonia as patient is well-appearing with normal vital sign afebrile with SPO2 of 98% on room air and breathing with a respiratory rate of 12-14 during my assessment.  Also doubt PE as he has no chest pain and is not tachypneic hypoxic or tachycardic.  Doubt CHF, although patient has a history of chronic leg swelling it is unchanged today he does not appear fluid overloaded or from his chronic lymphedema.  He states no changes in weight and no orthopnea or PND.  His breathing is very minimal I doubt that he is experiencing a significant COPD or asthma exacerbation.  Furthermore his symptoms are unchanged from his last visit at which time he had a chest x-ray effectively ruling out pneumothorax.  I also do not suspect pneumothorax is again he is not having chest pain pleuritic or otherwise nor does he have decreased breath sounds in either lung.  Suspect that patient's presentation today is secondary to his chronic shortness of breath which is likely multifactorial at least in part due to  his obesity as well as some degree of asthma or other restrictive airway disease which does not seem to be causing any acute issues today.  Reassessed patient he is well-appearing at this time.  Lungs are clear to auscultation bilaterally no wheezing.  Patient will be sent to pulmonology and states that he will follow-up this time.  At time of discharge his oxygen saturations under percent on room air pulse is within normal limits has no fever and lung exam is without abnormality.   Julian Richards was evaluated in Emergency Department on 07/23/2019 for the symptoms described in the history of present illness. He was evaluated in the context of the global COVID-19 pandemic, which necessitated consideration that the patient might be at risk for infection with the SARS-CoV-2 virus that causes COVID-19. Institutional protocols and algorithms that pertain to the evaluation of patients at risk for COVID-19 are in a state of rapid change based on information released by regulatory bodies including the CDC and federal and state organizations. These policies and algorithms were followed during the patient's care in the ED. I discussed this case with my attending physician who cosigned this note including patient's presenting symptoms, physical exam, and planned diagnostics and interventions. Attending physician stated agreement with plan or made changes to plan which were implemented.   Final Clinical Impression(s) / ED Diagnoses Final diagnoses:  Chronic shortness of breath    Rx / DC Orders ED Discharge Orders    None       Gailen Shelter, [place] 07/23/19 1158    Donnetta Hutching, MD 07/23/19 2002

## 2019-08-03 ENCOUNTER — Institutional Professional Consult (permissible substitution): Payer: Medicaid Other | Admitting: Pulmonary Disease

## 2019-08-06 ENCOUNTER — Emergency Department (HOSPITAL_COMMUNITY)
Admission: EM | Admit: 2019-08-06 | Discharge: 2019-08-06 | Discharge status: Against Medical Advice | Payer: Medicaid Other

## 2019-08-06 ENCOUNTER — Other Ambulatory Visit: Payer: Self-pay

## 2019-08-06 NOTE — ED Triage Notes (Signed)
In midst of triage, pt states he wants to leave after estimated wait time given.

## 2019-08-06 NOTE — ED Triage Notes (Signed)
Here for medication refill for his asthma, pt refuses to call his PCP for refill.  States "he ain't worth a hoot"  Pt refuses an oral temperature as well in triage.

## 2019-08-12 ENCOUNTER — Encounter (HOSPITAL_COMMUNITY): Payer: Self-pay | Admitting: Emergency Medicine

## 2019-08-12 ENCOUNTER — Emergency Department (HOSPITAL_COMMUNITY)
Admission: EM | Admit: 2019-08-12 | Discharge: 2019-08-12 | Disposition: A | Discharge status: Home / Self Care | Payer: Medicaid Other | Attending: Emergency Medicine | Admitting: Emergency Medicine

## 2019-08-12 ENCOUNTER — Other Ambulatory Visit: Payer: Self-pay

## 2019-08-12 ENCOUNTER — Emergency Department (HOSPITAL_COMMUNITY): Payer: Medicaid Other

## 2019-08-12 DIAGNOSIS — J45909 Unspecified asthma, uncomplicated: Secondary | ICD-10-CM | POA: Diagnosis not present

## 2019-08-12 DIAGNOSIS — Z79899 Other long term (current) drug therapy: Secondary | ICD-10-CM | POA: Insufficient documentation

## 2019-08-12 DIAGNOSIS — R0602 Shortness of breath: Secondary | ICD-10-CM | POA: Diagnosis present

## 2019-08-12 DIAGNOSIS — Z87891 Personal history of nicotine dependence: Secondary | ICD-10-CM | POA: Diagnosis not present

## 2019-08-12 DIAGNOSIS — R0609 Other forms of dyspnea: Secondary | ICD-10-CM | POA: Insufficient documentation

## 2019-08-12 DIAGNOSIS — Z794 Long term (current) use of insulin: Secondary | ICD-10-CM | POA: Insufficient documentation

## 2019-08-12 LAB — CBC WITH DIFFERENTIAL/PLATELET
Abs Immature Granulocytes: 0.04 10*3/uL (ref 0.00–0.07)
Basophils Absolute: 0.1 10*3/uL (ref 0.0–0.1)
Basophils Relative: 1 %
Eosinophils Absolute: 0.2 10*3/uL (ref 0.0–0.5)
Eosinophils Relative: 2 %
HCT: 43.8 % (ref 39.0–52.0)
Hemoglobin: 14.3 g/dL (ref 13.0–17.0)
Immature Granulocytes: 0 %
Lymphocytes Relative: 25 %
Lymphs Abs: 2.6 10*3/uL (ref 0.7–4.0)
MCH: 28.4 pg (ref 26.0–34.0)
MCHC: 32.6 g/dL (ref 30.0–36.0)
MCV: 87.1 fL (ref 80.0–100.0)
Monocytes Absolute: 0.7 10*3/uL (ref 0.1–1.0)
Monocytes Relative: 7 %
Neutro Abs: 6.8 10*3/uL (ref 1.7–7.7)
Neutrophils Relative %: 65 %
Platelets: 220 10*3/uL (ref 150–400)
RBC: 5.03 MIL/uL (ref 4.22–5.81)
RDW: 14.1 % (ref 11.5–15.5)
WBC: 10.4 10*3/uL (ref 4.0–10.5)
nRBC: 0 % (ref 0.0–0.2)

## 2019-08-12 LAB — BASIC METABOLIC PANEL
Anion gap: 10 (ref 5–15)
BUN: 17 mg/dL (ref 8–23)
CO2: 27 mmol/L (ref 22–32)
Calcium: 8.6 mg/dL — ABNORMAL LOW (ref 8.9–10.3)
Chloride: 104 mmol/L (ref 98–111)
Creatinine, Ser: 0.74 mg/dL (ref 0.61–1.24)
GFR calc Af Amer: >60 mL/min (ref >60)
GFR calc non Af Amer: >60 mL/min (ref >60)
Glucose, Bld: 349 mg/dL — ABNORMAL HIGH (ref 70–99)
Potassium: 3.4 mmol/L — ABNORMAL LOW (ref 3.5–5.1)
Sodium: 141 mmol/L (ref 135–145)

## 2019-08-12 MED ORDER — METHYLPREDNISOLONE 4 MG PO TBPK: ORAL_TABLET | ORAL | 0 refills | Status: DC

## 2019-08-12 NOTE — ED Notes (Signed)
Pt reports he is here , "for the same thing"  Reports shortness of breath Uses unhaler   Also complains of sore to his R heel area  States he is followed by Dr Cleophas Dunker for same   Here for prednisone  and inhaler

## 2019-08-12 NOTE — Discharge Instructions (Addendum)
Get help right away if: °Your shortness of breath gets worse. °You have shortness of breath when you are resting. °You feel light-headed or you faint. °You have a cough that is not controlled with medicines. °You cough up blood. °You have pain with breathing. °You have pain in your chest, arms, shoulders, or abdomen. °You have a fever. °You cannot walk up stairs or exercise the way that you normally do. °

## 2019-08-12 NOTE — ED Provider Notes (Signed)
[facility] EMERGENCY DEPARTMENT Provider Note   CSN: [id number] Arrival date & time: 08/12/19  1356     History Chief Complaint  Patient presents with  . Shortness of Breath    Julian Richards is a 64 y.o. male with obesity, who presents emergency department with chief complaint of shortness of breath.  He has chronic dyspnea.  He has a past medical history of COPD and asthma.  He is well-known to this emergency department.  Patient states that he has had some worsening shortness of breath over the past several days.  He states that it is all the time.  He does not feel that it is any worse with exertion.  Patient states that the elevator is broken where he lives and is having to work walk up 3 flights of stairs which is exhausting for him.  He has been increasing his albuterol use and had some old prednisone which he felt helped his breathing but only had 2 tablets.  He takes 40 mg of Lasix twice daily.  He states that he has had normal urinary output taking that medication.  He has chronic peripheral edema which is unchanged.  He denies fevers, chills, cough.  HPI     Past Medical History:  Diagnosis Date  . Asthma   . COPD (chronic obstructive pulmonary disease) (HCC)     There are no problems to display for this patient.   History reviewed. No pertinent surgical history.     Family History  Problem Relation Age of Onset  . Stroke Father   . Cancer Sister   . Stroke Other     Social History   Tobacco Use  . Smoking status: Former Research scientist (life sciences)  . Smokeless tobacco: Never Used  Substance Use Topics  . Alcohol use: Never  . Drug use: Never    Home Medications Prior to Admission medications   Medication Sig Start Date End Date Taking? Authorizing Provider  albuterol (VENTOLIN HFA) 108 (90 Base) MCG/ACT inhaler Inhale 2 puffs into the lungs every 6 (six) hours as needed for wheezing or shortness of breath. 07/20/19   Mitzi Hansen, MD  atorvastatin (LIPITOR) 80 MG  tablet Take 80 mg by mouth daily. 07/15/19   [provider]  budesonide-formoterol (SYMBICORT) 80-4.5 MCG/ACT inhaler Inhale 2 puffs into the lungs 2 (two) times daily. 07/01/19   Evalee Jefferson, PA-C  cetirizine (ZYRTEC) 10 MG tablet Take 10 mg by mouth daily. 07/15/19   [provider]  FIBERCON 625 MG tablet Take 625 mg by mouth daily. 07/27/19   [provider]  furosemide (LASIX) 40 MG tablet  07/15/19   [provider]  gabapentin (NEURONTIN) 800 MG tablet Take 800 mg by mouth 3 (three) times daily. 07/15/19   [provider]  glimepiride (AMARYL) 4 MG tablet Take 4 mg by mouth daily. 08/06/19   [provider]  hydrOXYzine (ATARAX/VISTARIL) 25 MG tablet Take 25 mg by mouth every 8 (eight) hours as needed. 07/20/19   [provider]  insulin aspart (NOVOLOG) 100 UNIT/ML injection SMARTSIG:50 Unit(s) SUB-Q Twice Daily 08/01/19   [provider]  ipratropium-albuterol (DUONEB) 0.5-2.5 (3) MG/3ML SOLN Take 3 mLs by nebulization every 6 (six) hours as needed. 03/27/19   [provider]  JARDIANCE 25 MG TABS tablet Take 25 mg by mouth daily. 07/05/19   [provider]  LANTUS 100 UNIT/ML injection SMARTSIG:50 Unit(s) SUB-Q Twice Daily 08/01/19   [provider]  lisinopril (ZESTRIL) 10 MG tablet  Take 1 tablet (10 mg total) by mouth daily. 05/24/19   Burgess Amor, PA-C  methylPREDNISolone (MEDROL DOSEPAK) 4 MG TBPK tablet Use as directed 08/12/19   Arthor Captain, PA-C  pantoprazole (PROTONIX) 40 MG tablet Take 40 mg by mouth daily. 06/12/19   [provider]  predniSONE (DELTASONE) 20 MG tablet Take 2 tablets (40 mg total) by mouth daily. 06/25/19   Triplett, Tammy, PA-C  sitaGLIPtin (JANUVIA) 100 MG tablet Take 1 tablet (100 mg total) by mouth daily. 05/29/19   Dione Booze, MD  Monte Fantasia INHUB 250-50 MCG/DOSE AEPB Inhale 1 puff into the lungs 2 (two) times daily. 05/21/19   [provider]     Allergies    Keflex [cephalexin]  Review of Systems   Review of Systems Ten systems reviewed and are negative for acute change, except as noted in the HPI.   Physical Exam Updated Vital Signs BP (!) 159/70   Pulse 67   Temp 97.8 F (36.6 C) (Oral)   Resp (!) 5   Ht 5\' 9"  (1.753 m)   Wt 127 kg   SpO2 95%   BMI 41.35 kg/m   Physical Exam Vitals and nursing note reviewed.  Constitutional:      General: He is not in acute distress.    Appearance: He is well-developed. He is not diaphoretic.  HENT:     Head: Normocephalic and atraumatic.  Eyes:     General: No scleral icterus.    Conjunctiva/sclera: Conjunctivae normal.  Cardiovascular:     Rate and Rhythm: Normal rate and regular rhythm.     Heart sounds: Normal heart sounds.  Pulmonary:     Effort: Pulmonary effort is normal. No respiratory distress.     Breath sounds: Normal breath sounds. No wheezing, rhonchi or rales.     Comments: Speaking in full sentences. No obvious dyspnea Abdominal:     Palpations: Abdomen is soft.     Tenderness: There is no abdominal tenderness.  Musculoskeletal:     Cervical back: Normal range of motion and neck supple.     Right lower leg: Edema present.     Left lower leg: Edema present.     Comments: BL 3+ pitting edema  Skin:    General: Skin is warm and dry.  Neurological:     Mental Status: He is alert.  Psychiatric:        Behavior: Behavior normal.     ED Results / Procedures / Treatments   Labs (all labs ordered are listed, but only abnormal results are displayed) Labs Reviewed  BASIC METABOLIC PANEL - Abnormal; Notable for the following components:      Result Value   Potassium 3.4 (*)    Glucose, Bld 349 (*)    Calcium 8.6 (*)    All other components within normal limits  CBC WITH DIFFERENTIAL/PLATELET    EKG None  Radiology DG Chest Port 1 View  Result Date: 08/12/2019 CLINICAL DATA:  Shortness of breath EXAM: PORTABLE CHEST 1 VIEW COMPARISON:   07/20/2019 FINDINGS: The heart size and mediastinal contours are within normal limits. Both lungs are clear. The visualized skeletal structures are unremarkable. IMPRESSION: No acute abnormality of the lungs in AP portable projection. Electronically Signed   By: 07/22/2019 M.D.   On: 08/12/2019 14:46    Procedures Procedures (including critical care time)  Medications Ordered in ED Medications - No data to display  ED Course  I have reviewed the triage vital signs and the nursing  notes.  Pertinent labs & imaging results that were available during my care of the patient were reviewed by me and considered in my medical decision making (see chart for details).    MDM Rules/Calculators/A&P                      Patient well known to this ER. States " I really just need a refill on my prednisone and then I can go home.:" He has known chronic dyspnea, COPD.  Chronic peripheral edema which is unchanged.  Patient warned that prednisone can make his blood sugars elevated.  His blood sugar today on lab evaluation is 349 he has no anion gap.  CBC without abnormality.  Personally reviewed the patient's chest x-ray which shows no edema or other abnormality on one view portable chest x-ray.  Patient is not wheezing and has clear lung sounds.  Patient was able to ambulate to the bathroom about 20 feet away without any exertional dyspnea or shortness of breath.  I doubt emergent cause of his chronic shortness of breath such as ACS,  PE, pleural effusion, pulmonary edema. There is no evidence of pneumothorax. Patient is not labored Given medrol Dosepak. OP f/u and return precautions.  He appears reasonably medically screened and appropriate for discharge at this time. Final Clinical Impression(s) / ED Diagnoses Final diagnoses:  Chronic dyspnea    Rx / DC Orders ED Discharge Orders         Ordered    methylPREDNISolone (MEDROL DOSEPAK) 4 MG TBPK tablet     08/12/19 1517           Arthor Captain, PA-C 08/12/19 2043    Long, Arlyss Repress, MD 08/13/19 1322

## 2019-08-12 NOTE — ED Triage Notes (Signed)
Pt reports shortness of breath for the past week or so. Using inhalers and took some old prednisone. Feels like he needs more prednisone.

## 2019-08-19 ENCOUNTER — Emergency Department (HOSPITAL_COMMUNITY): Payer: Medicaid Other

## 2019-08-19 ENCOUNTER — Other Ambulatory Visit: Payer: Self-pay

## 2019-08-19 ENCOUNTER — Encounter (HOSPITAL_COMMUNITY): Payer: Self-pay

## 2019-08-19 ENCOUNTER — Emergency Department (HOSPITAL_COMMUNITY)
Admission: EM | Admit: 2019-08-19 | Discharge: 2019-08-20 | Disposition: A | Discharge status: Home / Self Care | Payer: Medicaid Other | Attending: Emergency Medicine | Admitting: Emergency Medicine

## 2019-08-19 DIAGNOSIS — R0609 Other forms of dyspnea: Secondary | ICD-10-CM | POA: Diagnosis not present

## 2019-08-19 DIAGNOSIS — R0602 Shortness of breath: Secondary | ICD-10-CM | POA: Diagnosis present

## 2019-08-19 DIAGNOSIS — Z794 Long term (current) use of insulin: Secondary | ICD-10-CM | POA: Diagnosis not present

## 2019-08-19 DIAGNOSIS — J449 Chronic obstructive pulmonary disease, unspecified: Secondary | ICD-10-CM | POA: Diagnosis not present

## 2019-08-19 DIAGNOSIS — Z79899 Other long term (current) drug therapy: Secondary | ICD-10-CM | POA: Diagnosis not present

## 2019-08-19 DIAGNOSIS — E119 Type 2 diabetes mellitus without complications: Secondary | ICD-10-CM | POA: Insufficient documentation

## 2019-08-19 DIAGNOSIS — Z87891 Personal history of nicotine dependence: Secondary | ICD-10-CM | POA: Insufficient documentation

## 2019-08-19 MED ORDER — ALUM & MAG HYDROXIDE-SIMETH 200-200-20 MG/5ML PO SUSP
30.0000 mL | Freq: Once | ORAL | Status: AC
  Administered 2019-08-19: 30 mL via ORAL
  Filled 2019-08-19: qty 30

## 2019-08-19 MED ORDER — PANTOPRAZOLE SODIUM 40 MG PO TBEC
40.0000 mg | DELAYED_RELEASE_TABLET | Freq: Once | ORAL | Status: AC
  Administered 2019-08-19: 40 mg via ORAL
  Filled 2019-08-19: qty 1

## 2019-08-19 MED ORDER — PANTOPRAZOLE SODIUM 40 MG PO TBEC: 40.0000 mg | DELAYED_RELEASE_TABLET | Freq: Two times a day (BID) | ORAL | 0 refills | Status: AC

## 2019-08-19 MED ORDER — LIDOCAINE VISCOUS HCL 2 % MT SOLN
15.0000 mL | Freq: Once | OROMUCOSAL | Status: AC
  Administered 2019-08-19: 15 mL via ORAL
  Filled 2019-08-19: qty 15

## 2019-08-19 NOTE — Discharge Instructions (Signed)
Continue to use your albuterol inhaler as needed.  Start taking the Protonix twice a day as prescribed.  Follow-up with your doctor.

## 2019-08-19 NOTE — ED Triage Notes (Signed)
Pt c/o acid reflux for 4 months.

## 2019-08-19 NOTE — ED Notes (Signed)
Called x1  Not visualized in lobby  

## 2019-08-19 NOTE — ED Provider Notes (Signed)
[facility] EMERGENCY DEPARTMENT Provider Note   CSN: [id number] Arrival date & time: 08/19/19  2147     History Chief Complaint  Patient presents with  . Gastroesophageal Reflux    4 months    Julian Richards is a 64 y.o. male.  Patient returns, once again, with complaints of shortness of breath.  He has chronic shortness of breath and asthma.  He states that he started to feel worse tonight.  Since coming to the hospital, however, he has improved.  He has linked his shortness of breath to his reflux.  He reports that he was started on a low-dose Protonix and has done better on the higher dose Protonix in the past.  He is not experiencing any chest pain.  No fever or cough.        Past Medical History:  Diagnosis Date  . Asthma   . COPD (chronic obstructive pulmonary disease) (HCC)     There are no problems to display for this patient.   History reviewed. No pertinent surgical history.     Family History  Problem Relation Age of Onset  . Stroke Father   . Cancer Sister   . Stroke Other     Social History   Tobacco Use  . Smoking status: Former Games developer  . Smokeless tobacco: Never Used  Substance Use Topics  . Alcohol use: Never  . Drug use: Never    Home Medications Prior to Admission medications   Medication Sig Start Date End Date Taking? Authorizing Provider  albuterol (VENTOLIN HFA) 108 (90 Base) MCG/ACT inhaler Inhale 2 puffs into the lungs every 6 (six) hours as needed for wheezing or shortness of breath. 07/20/19   Elige Radon, MD  atorvastatin (LIPITOR) 80 MG tablet Take 80 mg by mouth daily. 07/15/19   [provider]  budesonide-formoterol (SYMBICORT) 80-4.5 MCG/ACT inhaler Inhale 2 puffs into the lungs 2 (two) times daily. 07/01/19   Burgess Amor, PA-C  cetirizine (ZYRTEC) 10 MG tablet Take 10 mg by mouth daily. 07/15/19   [provider]  FIBERCON 625 MG tablet Take 625 mg by mouth daily. 07/27/19   [provider]    furosemide (LASIX) 40 MG tablet  07/15/19   [provider]  gabapentin (NEURONTIN) 800 MG tablet Take 800 mg by mouth 3 (three) times daily. 07/15/19   [provider]  glimepiride (AMARYL) 4 MG tablet Take 4 mg by mouth daily. 08/06/19   [provider]  hydrOXYzine (ATARAX/VISTARIL) 25 MG tablet Take 25 mg by mouth every 8 (eight) hours as needed. 07/20/19   [provider]  insulin aspart (NOVOLOG) 100 UNIT/ML injection SMARTSIG:50 Unit(s) SUB-Q Twice Daily 08/01/19   [provider]  ipratropium-albuterol (DUONEB) 0.5-2.5 (3) MG/3ML SOLN Take 3 mLs by nebulization every 6 (six) hours as needed. 03/27/19   [provider]  JARDIANCE 25 MG TABS tablet Take 25 mg by mouth daily. 07/05/19   [provider]  LANTUS 100 UNIT/ML injection SMARTSIG:50 Unit(s) SUB-Q Twice Daily 08/01/19   [provider]  lisinopril (ZESTRIL) 10 MG tablet Take 1 tablet (10 mg total) by mouth daily. 05/24/19   Burgess Amor, PA-C  methylPREDNISolone (MEDROL DOSEPAK) 4 MG TBPK tablet Use as directed 08/12/19   Arthor Captain, PA-C  pantoprazole (PROTONIX) 40 MG tablet Take 1 tablet (40 mg total) by mouth 2 (two) times daily before a meal. 08/19/19   Itzy Adler, Canary Brim, MD  predniSONE (DELTASONE) 20 MG tablet Take 2 tablets (40  mg total) by mouth daily. 06/25/19   Triplett, Tammy, PA-C  sitaGLIPtin (JANUVIA) 100 MG tablet Take 1 tablet (100 mg total) by mouth daily. 05/29/19   Dione Booze, MD  Monte Fantasia INHUB 250-50 MCG/DOSE AEPB Inhale 1 puff into the lungs 2 (two) times daily. 05/21/19   [provider]    Allergies    Keflex [cephalexin]  Review of Systems   Review of Systems  Respiratory: Positive for shortness of breath.   All other systems reviewed and are negative.   Physical Exam Updated Vital Signs BP (!) 119/52   Pulse 77   Temp 97.7 F (36.5 C) (Oral)   Resp 19   Ht 5\' 9"  (1.753 m)   Wt 127 kg   SpO2 95%   BMI 41.35 kg/m    Physical Exam Vitals and nursing note reviewed.  Constitutional:      General: He is not in acute distress.    Appearance: Normal appearance. He is well-developed.  HENT:     Head: Normocephalic and atraumatic.     Right Ear: Hearing normal.     Left Ear: Hearing normal.     Nose: Nose normal.  Eyes:     Conjunctiva/sclera: Conjunctivae normal.     Pupils: Pupils are equal, round, and reactive to light.  Cardiovascular:     Rate and Rhythm: Regular rhythm.     Heart sounds: S1 normal and S2 normal. No murmur. No friction rub. No gallop.   Pulmonary:     Effort: Pulmonary effort is normal. No respiratory distress.     Breath sounds: Normal breath sounds.  Chest:     Chest wall: No tenderness.  Abdominal:     General: Bowel sounds are normal.     Palpations: Abdomen is soft.     Tenderness: There is no abdominal tenderness. There is no guarding or rebound. Negative signs include Murphy's sign and McBurney's sign.     Hernia: No hernia is present.  Musculoskeletal:        General: Normal range of motion.     Cervical back: Normal range of motion and neck supple.  Skin:    General: Skin is warm and dry.     Findings: No rash.  Neurological:     Mental Status: He is alert and oriented to person, place, and time.     GCS: GCS eye subscore is 4. GCS verbal subscore is 5. GCS motor subscore is 6.     Cranial Nerves: No cranial nerve deficit.     Sensory: No sensory deficit.     Coordination: Coordination normal.  Psychiatric:        Speech: Speech normal.        Behavior: Behavior normal.        Thought Content: Thought content normal.     ED Results / Procedures / Treatments   Labs (all labs ordered are listed, but only abnormal results are displayed) Labs Reviewed - No data to display  EKG EKG Interpretation  Date/Time:  Saturday August 19 2019 23:32:41 EDT Ventricular Rate:  80 PR Interval:    QRS Duration: 115 QT Interval:  464 QTC Calculation: 536 R  Axis:   53 Text Interpretation: Sinus rhythm Nonspecific intraventricular conduction delay No significant change since last tracing Confirmed by 06-15-1986 [phone]) on 08/19/2019 11:42:33 PM   Radiology DG Chest Port 1 View  Result Date: 08/19/2019 CLINICAL DATA:  Shortness of breath EXAM: PORTABLE CHEST 1 VIEW COMPARISON:  08/12/2019 FINDINGS:  Heart and mediastinal contours are within normal limits. No focal opacities or effusions. No acute bony abnormality. IMPRESSION: No active cardiopulmonary disease. Electronically Signed   By: Rolm Baptise M.D.   On: 08/19/2019 23:47    Procedures Procedures (including critical care time)  Medications Ordered in ED Medications  pantoprazole (PROTONIX) EC tablet 40 mg (40 mg Oral Given 08/19/19 2333)  alum & mag hydroxide-simeth (MAALOX/MYLANTA) 200-200-20 MG/5ML suspension 30 mL (30 mLs Oral Given 08/19/19 2333)    And  lidocaine (XYLOCAINE) 2 % viscous mouth solution 15 mL (15 mLs Oral Given 08/19/19 2333)    ED Course  I have reviewed the triage vital signs and the nursing notes.  Pertinent labs & imaging results that were available during my care of the patient were reviewed by me and considered in my medical decision making (see chart for details).    MDM Rules/Calculators/A&P                      Patient well-known to this department presents with shortness of breath.  He has had numerous similar presentations.  Patient is in no distress.  Lung examination reveals good air movement without any wheezing or abnormal lung sounds.  He is not hypoxic.  Patient reports that he thinks it is his reflux disease.  We will increase his Protonix.  Patient encouraged to follow-up with his PCP.  Final Clinical Impression(s) / ED Diagnoses Final diagnoses:  Chronic dyspnea    Rx / DC Orders ED Discharge Orders         Ordered    pantoprazole (PROTONIX) 40 MG tablet  2 times daily before meals     08/19/19 2351           Orpah Greek, MD 08/21/19 [phone]

## 2019-08-19 NOTE — ED Notes (Signed)
X-Ray at bedside.

## 2019-08-24 ENCOUNTER — Other Ambulatory Visit: Payer: Self-pay

## 2019-08-24 ENCOUNTER — Emergency Department (HOSPITAL_COMMUNITY)
Admission: EM | Admit: 2019-08-24 | Discharge: 2019-08-24 | Disposition: A | Discharge status: Home / Self Care | Payer: Medicaid Other | Attending: Emergency Medicine | Admitting: Emergency Medicine

## 2019-08-24 ENCOUNTER — Emergency Department (HOSPITAL_COMMUNITY): Payer: Medicaid Other

## 2019-08-24 ENCOUNTER — Encounter (HOSPITAL_COMMUNITY): Payer: Self-pay | Admitting: *Deleted

## 2019-08-24 DIAGNOSIS — Z87891 Personal history of nicotine dependence: Secondary | ICD-10-CM | POA: Insufficient documentation

## 2019-08-24 DIAGNOSIS — Z794 Long term (current) use of insulin: Secondary | ICD-10-CM | POA: Diagnosis not present

## 2019-08-24 DIAGNOSIS — Z79899 Other long term (current) drug therapy: Secondary | ICD-10-CM | POA: Insufficient documentation

## 2019-08-24 DIAGNOSIS — J45909 Unspecified asthma, uncomplicated: Secondary | ICD-10-CM | POA: Diagnosis not present

## 2019-08-24 DIAGNOSIS — R0602 Shortness of breath: Secondary | ICD-10-CM | POA: Insufficient documentation

## 2019-08-24 DIAGNOSIS — R600 Localized edema: Secondary | ICD-10-CM | POA: Diagnosis not present

## 2019-08-24 NOTE — Discharge Instructions (Addendum)
Your exam today is reassuring. There are no concerning findings on your chest xray. Please continue with your established treatment plan for asthma. Be sure to continue with your zyrtec.
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
## 2019-09-12 ENCOUNTER — Ambulatory Visit (HOSPITAL_COMMUNITY): Payer: Medicaid Other

## 2019-09-12 ENCOUNTER — Encounter (HOSPITAL_COMMUNITY): Payer: Self-pay

## 2019-09-12 ENCOUNTER — Other Ambulatory Visit: Payer: Self-pay

## 2019-09-12 ENCOUNTER — Emergency Department (HOSPITAL_COMMUNITY)
Admission: EM | Admit: 2019-09-12 | Discharge: 2019-09-12 | Disposition: A | Discharge status: Home / Self Care | Payer: Medicaid Other | Attending: Emergency Medicine | Admitting: Emergency Medicine

## 2019-09-12 DIAGNOSIS — R0602 Shortness of breath: Secondary | ICD-10-CM | POA: Diagnosis present

## 2019-09-12 DIAGNOSIS — Z5321 Procedure and treatment not carried out due to patient leaving prior to being seen by health care provider: Secondary | ICD-10-CM | POA: Diagnosis not present

## 2019-09-12 NOTE — ED Triage Notes (Signed)
Pt presents to ED with complaints of SOB x 4 days. Pt denies cough and fever.

## 2019-09-17 ENCOUNTER — Emergency Department (HOSPITAL_COMMUNITY): Payer: Medicaid Other

## 2019-09-17 ENCOUNTER — Other Ambulatory Visit: Payer: Self-pay

## 2019-09-17 ENCOUNTER — Encounter (HOSPITAL_COMMUNITY): Payer: Self-pay | Admitting: Emergency Medicine

## 2019-09-17 ENCOUNTER — Emergency Department (HOSPITAL_COMMUNITY)
Admission: EM | Admit: 2019-09-17 | Discharge: 2019-09-17 | Disposition: A | Discharge status: Home / Self Care | Payer: Medicaid Other | Attending: Emergency Medicine | Admitting: Emergency Medicine

## 2019-09-17 DIAGNOSIS — J449 Chronic obstructive pulmonary disease, unspecified: Secondary | ICD-10-CM | POA: Insufficient documentation

## 2019-09-17 DIAGNOSIS — Z87891 Personal history of nicotine dependence: Secondary | ICD-10-CM | POA: Insufficient documentation

## 2019-09-17 DIAGNOSIS — R0602 Shortness of breath: Secondary | ICD-10-CM | POA: Diagnosis present

## 2019-09-17 DIAGNOSIS — Z79899 Other long term (current) drug therapy: Secondary | ICD-10-CM | POA: Diagnosis not present

## 2019-09-17 DIAGNOSIS — Z794 Long term (current) use of insulin: Secondary | ICD-10-CM | POA: Insufficient documentation

## 2019-09-17 DIAGNOSIS — R05 Cough: Secondary | ICD-10-CM | POA: Diagnosis not present

## 2019-09-17 DIAGNOSIS — R2243 Localized swelling, mass and lump, lower limb, bilateral: Secondary | ICD-10-CM | POA: Insufficient documentation

## 2019-09-17 MED ORDER — ALBUTEROL SULFATE HFA 108 (90 BASE) MCG/ACT IN AERS: 2.0000 | INHALATION_SPRAY | RESPIRATORY_TRACT | 3 refills | Status: AC | PRN

## 2019-09-17 MED ORDER — PREDNISONE 20 MG PO TABS: 40.0000 mg | ORAL_TABLET | Freq: Every day | ORAL | 0 refills | Status: DC

## 2019-09-17 MED ORDER — ALBUTEROL SULFATE HFA 108 (90 BASE) MCG/ACT IN AERS
2.0000 | INHALATION_SPRAY | RESPIRATORY_TRACT | Status: DC | PRN
  Administered 2019-09-17: 2 via RESPIRATORY_TRACT
  Filled 2019-09-17: qty 6.7

## 2019-09-17 MED ORDER — PREDNISONE 50 MG PO TABS
60.0000 mg | ORAL_TABLET | Freq: Once | ORAL | Status: AC
  Administered 2019-09-17: 60 mg via ORAL
  Filled 2019-09-17: qty 1

## 2019-09-17 NOTE — ED Triage Notes (Signed)
Pt complaint of shortness of breath since 1700 today   started "sitting down"  Is out of his inhaler   No physician   Here for eval

## 2019-09-17 NOTE — ED Provider Notes (Signed)
[facility] EMERGENCY DEPARTMENT Provider Note   CSN: [id number] Arrival date & time: 09/17/19  2147     History Chief Complaint  Patient presents with  . Shortness of Breath    Julian Richards is a 64 y.o. male.  Patient presents to the emergency department for evaluation of shortness of breath.  Patient reports that he has been trying to use his inhaler but nothing is coming out.  He has a history of chronic shortness of breath as well as chronic peripheral edema which is unchanged.  He is not experiencing any chest pain.  He does have some cough but has not had any fever.        Past Medical History:  Diagnosis Date  . Asthma   . COPD (chronic obstructive pulmonary disease) (HCC)     There are no problems to display for this patient.   History reviewed. No pertinent surgical history.     Family History  Problem Relation Age of Onset  . Stroke Father   . Cancer Sister   . Stroke Other     Social History   Tobacco Use  . Smoking status: Former Research scientist (life sciences)  . Smokeless tobacco: Never Used  Substance Use Topics  . Alcohol use: Never  . Drug use: Never    Home Medications Prior to Admission medications   Medication Sig Start Date End Date Taking? Authorizing Provider  albuterol (VENTOLIN HFA) 108 (90 Base) MCG/ACT inhaler Inhale 2 puffs into the lungs every 6 (six) hours as needed for wheezing or shortness of breath. 07/20/19   Mitzi Hansen, MD  albuterol (VENTOLIN HFA) 108 (90 Base) MCG/ACT inhaler Inhale 2 puffs into the lungs every 2 (two) hours as needed for wheezing or shortness of breath (cough). 09/17/19   Orpah Greek, MD  atorvastatin (LIPITOR) 80 MG tablet Take 80 mg by mouth daily. 07/15/19   [provider]  budesonide-formoterol (SYMBICORT) 80-4.5 MCG/ACT inhaler Inhale 2 puffs into the lungs 2 (two) times daily. 07/01/19   Evalee Jefferson, PA-C  cetirizine (ZYRTEC) 10 MG tablet Take 10 mg by mouth daily. 07/15/19   [provider]  FIBERCON 625 MG tablet Take 625 mg by mouth daily. 07/27/19   [provider]  furosemide (LASIX) 40 MG tablet  07/15/19   [provider]  gabapentin (NEURONTIN) 800 MG tablet Take 800 mg by mouth 3 (three) times daily. 07/15/19   [provider]  glimepiride (AMARYL) 4 MG tablet Take 4 mg by mouth daily. 08/06/19   [provider]  hydrOXYzine (ATARAX/VISTARIL) 25 MG tablet Take 25 mg by mouth every 8 (eight) hours as needed. 07/20/19   [provider]  insulin aspart (NOVOLOG) 100 UNIT/ML injection SMARTSIG:50 Unit(s) SUB-Q Twice Daily 08/01/19   [provider]  ipratropium-albuterol (DUONEB) 0.5-2.5 (3) MG/3ML SOLN Take 3 mLs by nebulization every 6 (six) hours as needed. 03/27/19   [provider]  JARDIANCE 25 MG TABS tablet Take 25 mg by mouth daily. 07/05/19   [provider]  LANTUS 100 UNIT/ML injection SMARTSIG:50 Unit(s) SUB-Q Twice Daily 08/01/19   [provider]  lisinopril (ZESTRIL) 10 MG tablet Take 1 tablet (10 mg total) by mouth daily. 05/24/19   Evalee Jefferson, PA-C  methylPREDNISolone (MEDROL DOSEPAK) 4 MG TBPK tablet Use as directed 08/12/19   Margarita Mail, PA-C  pantoprazole (PROTONIX) 40 MG tablet Take 1 tablet (40 mg total) by mouth 2 (two) times daily before a meal. 08/19/19   Joseph Berkshire  J, MD  predniSONE (DELTASONE) 20 MG tablet Take 2 tablets (40 mg total) by mouth daily with breakfast. 09/17/19   Irem Stoneham, Canary Brim, MD  sitaGLIPtin (JANUVIA) 100 MG tablet Take 1 tablet (100 mg total) by mouth daily. 05/29/19   Dione Booze, MD  Monte Fantasia INHUB 250-50 MCG/DOSE AEPB Inhale 1 puff into the lungs 2 (two) times daily. 05/21/19   [provider]    Allergies    Keflex [cephalexin]  Review of Systems   Review of Systems  Respiratory: Positive for cough and shortness of breath.   Cardiovascular: Positive for leg swelling.  All other systems reviewed and are negative.   Physical  Exam Updated Vital Signs BP (!) 110/57 (BP Location: Right Arm)   Pulse 89   Temp 98.2 F (36.8 C) (Oral)   Resp 18   Ht 5\' 9"  (1.753 m)   Wt 124.7 kg   SpO2 98%   BMI 40.61 kg/m   Physical Exam Vitals and nursing note reviewed.  Constitutional:      General: He is not in acute distress.    Appearance: Normal appearance. He is well-developed.  HENT:     Head: Normocephalic and atraumatic.     Right Ear: Hearing normal.     Left Ear: Hearing normal.     Nose: Nose normal.  Eyes:     Conjunctiva/sclera: Conjunctivae normal.     Pupils: Pupils are equal, round, and reactive to light.  Cardiovascular:     Rate and Rhythm: Regular rhythm.     Heart sounds: S1 normal and S2 normal. No murmur. No friction rub. No gallop.   Pulmonary:     Effort: Pulmonary effort is normal. No respiratory distress.     Breath sounds: Normal breath sounds.  Chest:     Chest wall: No tenderness.  Abdominal:     General: Bowel sounds are normal.     Palpations: Abdomen is soft.     Tenderness: There is no abdominal tenderness. There is no guarding or rebound. Negative signs include Murphy's sign and McBurney's sign.     Hernia: No hernia is present.  Musculoskeletal:        General: Normal range of motion.     Cervical back: Normal range of motion and neck supple.     Right lower leg: Edema present.     Left lower leg: Edema present.  Skin:    General: Skin is warm and dry.     Findings: Rash (Lower legs with skin changes consistent with chronic peripheral edema) present.  Neurological:     Mental Status: He is alert and oriented to person, place, and time.     GCS: GCS eye subscore is 4. GCS verbal subscore is 5. GCS motor subscore is 6.     Cranial Nerves: No cranial nerve deficit.     Sensory: No sensory deficit.     Coordination: Coordination normal.  Psychiatric:        Speech: Speech normal.        Behavior: Behavior normal.        Thought Content: Thought content normal.     ED  Results / Procedures / Treatments   Labs (all labs ordered are listed, but only abnormal results are displayed) Labs Reviewed - No data to display  EKG None  Radiology DG Chest 2 View  Result Date: 09/17/2019 CLINICAL DATA:  Shortness of breath EXAM: CHEST - 2 VIEW COMPARISON:  08/24/2019 FINDINGS: Heart and mediastinal contours are  within normal limits. No focal opacities or effusions. No acute bony abnormality. IMPRESSION: No active cardiopulmonary disease. Electronically Signed   By: Charlett Nose M.D.   On: 09/17/2019 22:29    Procedures Procedures (including critical care time)  Medications Ordered in ED Medications  predniSONE (DELTASONE) tablet 60 mg (has no administration in time range)  albuterol (VENTOLIN HFA) 108 (90 Base) MCG/ACT inhaler 2 puff (has no administration in time range)    ED Course  I have reviewed the triage vital signs and the nursing notes.  Pertinent labs & imaging results that were available during my care of the patient were reviewed by me and considered in my medical decision making (see chart for details).    MDM Rules/Calculators/A&P                      Patient is in no distress currently.  He appears to be breathing comfortably.  Oxygen saturation is 98%.  Lungs are clear.  No tachycardia.  He does not have any cardiac complaints.  He does have swelling of both of his legs but this is consistent with his chronic edema.  He appears, from reviewing records, that he is supposed to take Lasix twice a day but frequently is only taking it once a day.  I did discuss with him the necessity of taking his Lasix twice a day to help with fluid retention.  Additionally, patient feels like his breathing is secondary to his chronic lung disease.  It sounds like his inhaler is out.  He also reports that he often requires prednisone when he feels like this.  His chest x-ray is clear, no evidence of pneumonia, effusion, congestive heart failure.  Plan will be to  increase his Lasix to as prescribed twice a day, refill his inhaler, give him a burst of steroids.  Case management consult in the morning to help him follow-up with PCP follow-up.  Final Clinical Impression(s) / ED Diagnoses Final diagnoses:  Shortness of breath    Rx / DC Orders ED Discharge Orders         Ordered    predniSONE (DELTASONE) 20 MG tablet  Daily with breakfast     09/17/19 2326    albuterol (VENTOLIN HFA) 108 (90 Base) MCG/ACT inhaler  Every 2 hours PRN     09/17/19 2326           Gilda Crease, MD 09/17/19 2326

## 2019-09-17 NOTE — ED Triage Notes (Signed)
Pt reports he has no physician   Needs "something more than I am getting, Cause I'm getting worse"  Pt needs social work consult to help facilitate contacting with a PCP as pt appears cognitively compromised

## 2019-09-25 ENCOUNTER — Other Ambulatory Visit: Payer: Self-pay

## 2019-09-25 ENCOUNTER — Emergency Department (HOSPITAL_COMMUNITY)
Admission: EM | Admit: 2019-09-25 | Discharge: 2019-09-25 | Discharge status: Absent without leave | Payer: Medicaid Other

## 2019-11-05 ENCOUNTER — Other Ambulatory Visit: Payer: Self-pay

## 2019-11-05 ENCOUNTER — Encounter (HOSPITAL_COMMUNITY): Payer: Self-pay | Admitting: *Deleted

## 2019-11-05 DIAGNOSIS — Z79899 Other long term (current) drug therapy: Secondary | ICD-10-CM | POA: Insufficient documentation

## 2019-11-05 DIAGNOSIS — Z87891 Personal history of nicotine dependence: Secondary | ICD-10-CM | POA: Diagnosis not present

## 2019-11-05 DIAGNOSIS — J449 Chronic obstructive pulmonary disease, unspecified: Secondary | ICD-10-CM | POA: Insufficient documentation

## 2019-11-05 DIAGNOSIS — L03211 Cellulitis of face: Secondary | ICD-10-CM | POA: Diagnosis not present

## 2019-11-05 DIAGNOSIS — R519 Headache, unspecified: Secondary | ICD-10-CM | POA: Diagnosis present

## 2019-11-05 NOTE — ED Triage Notes (Signed)
Pt c/o nasal pain that started two days ago, denies any congestion, denies any fever,

## 2019-11-06 ENCOUNTER — Emergency Department (HOSPITAL_COMMUNITY)
Admission: EM | Admit: 2019-11-06 | Discharge: 2019-11-06 | Disposition: A | Discharge status: Home / Self Care | Payer: Medicaid Other | Attending: Emergency Medicine | Admitting: Emergency Medicine

## 2019-11-06 DIAGNOSIS — L03211 Cellulitis of face: Secondary | ICD-10-CM

## 2019-11-06 MED ORDER — CLINDAMYCIN HCL 150 MG PO CAPS
150.0000 mg | ORAL_CAPSULE | Freq: Once | ORAL | Status: AC
  Administered 2019-11-06: 150 mg via ORAL
  Filled 2019-11-06: qty 1

## 2019-11-06 MED ORDER — CLINDAMYCIN HCL 150 MG PO CAPS: 150.0000 mg | ORAL_CAPSULE | Freq: Four times a day (QID) | ORAL | 0 refills | Status: AC

## 2019-11-06 MED ORDER — IBUPROFEN 400 MG PO TABS
400.0000 mg | ORAL_TABLET | Freq: Once | ORAL | Status: AC
  Administered 2019-11-06: 400 mg via ORAL
  Filled 2019-11-06: qty 1

## 2019-11-06 NOTE — ED Notes (Signed)
Pt ambulatory to waiting room. Pt verbalized understanding of discharge instructions.   
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
## 2019-11-06 NOTE — Discharge Instructions (Addendum)
Apply warm compresses several times a day.  Take acetaminophen and/or ibuprofen as needed for pain. Combining them gives you better pain relief than either one by itself.  Return if symptoms are getting worse.

## 2019-11-12 ENCOUNTER — Emergency Department (HOSPITAL_COMMUNITY)
Admission: EM | Admit: 2019-11-12 | Discharge: 2019-11-12 | Disposition: A | Discharge status: Home / Self Care | Payer: Medicaid Other | Attending: Emergency Medicine | Admitting: Emergency Medicine

## 2019-11-12 ENCOUNTER — Other Ambulatory Visit: Payer: Self-pay

## 2019-11-12 ENCOUNTER — Emergency Department (HOSPITAL_COMMUNITY): Payer: Medicaid Other

## 2019-11-12 ENCOUNTER — Encounter (HOSPITAL_COMMUNITY): Payer: Self-pay | Admitting: Emergency Medicine

## 2019-11-12 DIAGNOSIS — Z87891 Personal history of nicotine dependence: Secondary | ICD-10-CM | POA: Insufficient documentation

## 2019-11-12 DIAGNOSIS — E119 Type 2 diabetes mellitus without complications: Secondary | ICD-10-CM | POA: Diagnosis not present

## 2019-11-12 DIAGNOSIS — J449 Chronic obstructive pulmonary disease, unspecified: Secondary | ICD-10-CM | POA: Insufficient documentation

## 2019-11-12 DIAGNOSIS — J441 Chronic obstructive pulmonary disease with (acute) exacerbation: Secondary | ICD-10-CM

## 2019-11-12 DIAGNOSIS — I10 Essential (primary) hypertension: Secondary | ICD-10-CM | POA: Diagnosis not present

## 2019-11-12 DIAGNOSIS — Z79899 Other long term (current) drug therapy: Secondary | ICD-10-CM | POA: Diagnosis not present

## 2019-11-12 DIAGNOSIS — Z794 Long term (current) use of insulin: Secondary | ICD-10-CM | POA: Diagnosis not present

## 2019-11-12 DIAGNOSIS — Z881 Allergy status to other antibiotic agents status: Secondary | ICD-10-CM | POA: Insufficient documentation

## 2019-11-12 DIAGNOSIS — J45909 Unspecified asthma, uncomplicated: Secondary | ICD-10-CM | POA: Insufficient documentation

## 2019-11-12 DIAGNOSIS — R0602 Shortness of breath: Secondary | ICD-10-CM | POA: Diagnosis present

## 2019-11-12 MED ORDER — PREDNISONE 10 MG PO TABS: 40.0000 mg | ORAL_TABLET | Freq: Every day | ORAL | 0 refills | Status: DC

## 2019-11-12 MED ORDER — PREDNISONE 50 MG PO TABS
60.0000 mg | ORAL_TABLET | Freq: Once | ORAL | Status: AC
  Administered 2019-11-12: 60 mg via ORAL
  Filled 2019-11-12: qty 1

## 2019-11-12 MED ORDER — ALBUTEROL SULFATE HFA 108 (90 BASE) MCG/ACT IN AERS
2.0000 | INHALATION_SPRAY | Freq: Four times a day (QID) | RESPIRATORY_TRACT | Status: DC | PRN
  Administered 2019-11-12: 2 via RESPIRATORY_TRACT
  Filled 2019-11-12: qty 6.7

## 2019-11-12 NOTE — ED Triage Notes (Signed)
Patient states shortness of breath that started earlier today. Patient states he has a history of asthma and has used his inhaler earlier at home with little to no relief.

## 2019-11-12 NOTE — Discharge Instructions (Addendum)
Use albuterol inhaler 2 puffs every 6 hours.  Take the prednisone as directed for the next 5 days.  Return for any new or worse symptoms.

## 2019-11-12 NOTE — ED Notes (Signed)
Pt with ? Cognitive delay   He is increasingly anxious re wanting to see provider   Messaged provider and asked that pt be seen
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
## 2019-11-12 NOTE — ED Notes (Signed)
Pt is here usually for inhaler when out  He is from [place] with no provider   He reports he has a bit of inhaler left, but that he was given pills to help him breathe and he is out of them   He wishes another script for the pills

## 2019-11-20 ENCOUNTER — Emergency Department (HOSPITAL_COMMUNITY)
Admission: EM | Admit: 2019-11-20 | Discharge: 2019-11-20 | Discharge status: Against Medical Advice | Payer: Medicaid Other

## 2019-11-29 ENCOUNTER — Emergency Department (HOSPITAL_COMMUNITY)
Admission: EM | Admit: 2019-11-29 | Discharge: 2019-11-30 | Discharge status: Against Medical Advice | Payer: Medicaid Other | Attending: Emergency Medicine | Admitting: Emergency Medicine

## 2019-11-29 ENCOUNTER — Emergency Department (HOSPITAL_COMMUNITY): Payer: Medicaid Other

## 2019-11-29 ENCOUNTER — Other Ambulatory Visit: Payer: Self-pay

## 2019-11-29 ENCOUNTER — Encounter (HOSPITAL_COMMUNITY): Payer: Self-pay

## 2019-11-29 DIAGNOSIS — J45909 Unspecified asthma, uncomplicated: Secondary | ICD-10-CM | POA: Insufficient documentation

## 2019-11-29 DIAGNOSIS — L97519 Non-pressure chronic ulcer of other part of right foot with unspecified severity: Secondary | ICD-10-CM | POA: Diagnosis present

## 2019-11-29 DIAGNOSIS — J449 Chronic obstructive pulmonary disease, unspecified: Secondary | ICD-10-CM | POA: Insufficient documentation

## 2019-11-29 DIAGNOSIS — L03115 Cellulitis of right lower limb: Secondary | ICD-10-CM | POA: Diagnosis not present

## 2019-11-29 DIAGNOSIS — Z79899 Other long term (current) drug therapy: Secondary | ICD-10-CM | POA: Insufficient documentation

## 2019-11-29 DIAGNOSIS — Z87891 Personal history of nicotine dependence: Secondary | ICD-10-CM | POA: Diagnosis not present

## 2019-11-29 DIAGNOSIS — Z7951 Long term (current) use of inhaled steroids: Secondary | ICD-10-CM | POA: Diagnosis not present

## 2019-11-29 NOTE — ED Triage Notes (Signed)
Pt here after leaving AMA from [facility] states he needs to be admitted for his ulcer on his right foot.

## 2019-11-30 LAB — CBC WITH DIFFERENTIAL/PLATELET
Abs Immature Granulocytes: 0.04 10*3/uL (ref 0.00–0.07)
Basophils Absolute: 0.1 10*3/uL (ref 0.0–0.1)
Basophils Relative: 1 %
Eosinophils Absolute: 0.1 10*3/uL (ref 0.0–0.5)
Eosinophils Relative: 1 %
HCT: 44.4 % (ref 39.0–52.0)
Hemoglobin: 13.8 g/dL (ref 13.0–17.0)
Immature Granulocytes: 0 %
Lymphocytes Relative: 21 %
Lymphs Abs: 2.5 10*3/uL (ref 0.7–4.0)
MCH: 27.5 pg (ref 26.0–34.0)
MCHC: 31.1 g/dL (ref 30.0–36.0)
MCV: 88.6 fL (ref 80.0–100.0)
Monocytes Absolute: 1 10*3/uL (ref 0.1–1.0)
Monocytes Relative: 8 %
Neutro Abs: 8.3 10*3/uL — ABNORMAL HIGH (ref 1.7–7.7)
Neutrophils Relative %: 69 %
Platelets: 227 10*3/uL (ref 150–400)
RBC: 5.01 MIL/uL (ref 4.22–5.81)
RDW: 13.5 % (ref 11.5–15.5)
WBC: 12 10*3/uL — ABNORMAL HIGH (ref 4.0–10.5)
nRBC: 0 % (ref 0.0–0.2)

## 2019-11-30 LAB — BASIC METABOLIC PANEL
Anion gap: 10 (ref 5–15)
BUN: 13 mg/dL (ref 8–23)
CO2: 26 mmol/L (ref 22–32)
Calcium: 8.6 mg/dL — ABNORMAL LOW (ref 8.9–10.3)
Chloride: 102 mmol/L (ref 98–111)
Creatinine, Ser: 0.8 mg/dL (ref 0.61–1.24)
GFR calc Af Amer: >60 mL/min (ref >60)
GFR calc non Af Amer: >60 mL/min (ref >60)
Glucose, Bld: 249 mg/dL — ABNORMAL HIGH (ref 70–99)
Potassium: 4.3 mmol/L (ref 3.5–5.1)
Sodium: 138 mmol/L (ref 135–145)

## 2019-11-30 MED ORDER — GABAPENTIN 400 MG PO CAPS
800.0000 mg | ORAL_CAPSULE | Freq: Three times a day (TID) | ORAL | Status: DC
  Filled 2019-11-30: qty 2

## 2019-11-30 MED ORDER — VANCOMYCIN HCL 2000 MG/400ML IV SOLN
2000.0000 mg | Freq: Once | INTRAVENOUS | Status: DC
  Filled 2019-11-30: qty 400

## 2019-11-30 MED ORDER — ATORVASTATIN CALCIUM 40 MG PO TABS
80.0000 mg | ORAL_TABLET | Freq: Every day | ORAL | Status: DC
  Filled 2019-11-30: qty 2

## 2019-11-30 MED ORDER — LISINOPRIL 10 MG PO TABS
10.0000 mg | ORAL_TABLET | Freq: Every day | ORAL | Status: DC
  Filled 2019-11-30: qty 1

## 2019-11-30 MED ORDER — PIPERACILLIN-TAZOBACTAM 3.375 G IVPB 30 MIN
3.3750 g | Freq: Once | INTRAVENOUS | Status: DC
  Filled 2019-11-30: qty 50

## 2019-11-30 NOTE — ED Notes (Signed)
Went into pts room to start treatment, explained to pt what had been ordered. Explained to pt that we needed to COVID test him for admission. Pt immediately became agitated and began yelling and cussing at this RN. Pt stated "I will just fucking go home then!". Pt gathered his belongings and walked to the lobby. MD notified.
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
## 2019-12-02 ENCOUNTER — Emergency Department (HOSPITAL_COMMUNITY): Payer: Medicaid Other

## 2019-12-02 ENCOUNTER — Emergency Department (HOSPITAL_COMMUNITY)
Admission: EM | Admit: 2019-12-02 | Discharge: 2019-12-02 | Disposition: A | Discharge status: Home / Self Care | Payer: Medicaid Other | Attending: Emergency Medicine | Admitting: Emergency Medicine

## 2019-12-02 ENCOUNTER — Encounter (HOSPITAL_COMMUNITY): Payer: Self-pay | Admitting: Emergency Medicine

## 2019-12-02 ENCOUNTER — Other Ambulatory Visit: Payer: Self-pay

## 2019-12-02 DIAGNOSIS — Z79899 Other long term (current) drug therapy: Secondary | ICD-10-CM | POA: Diagnosis not present

## 2019-12-02 DIAGNOSIS — Y939 Activity, unspecified: Secondary | ICD-10-CM | POA: Insufficient documentation

## 2019-12-02 DIAGNOSIS — J449 Chronic obstructive pulmonary disease, unspecified: Secondary | ICD-10-CM | POA: Diagnosis not present

## 2019-12-02 DIAGNOSIS — E119 Type 2 diabetes mellitus without complications: Secondary | ICD-10-CM | POA: Diagnosis not present

## 2019-12-02 DIAGNOSIS — I10 Essential (primary) hypertension: Secondary | ICD-10-CM | POA: Insufficient documentation

## 2019-12-02 DIAGNOSIS — J45909 Unspecified asthma, uncomplicated: Secondary | ICD-10-CM | POA: Insufficient documentation

## 2019-12-02 DIAGNOSIS — L97509 Non-pressure chronic ulcer of other part of unspecified foot with unspecified severity: Secondary | ICD-10-CM

## 2019-12-02 DIAGNOSIS — L97511 Non-pressure chronic ulcer of other part of right foot limited to breakdown of skin: Secondary | ICD-10-CM

## 2019-12-02 DIAGNOSIS — Z794 Long term (current) use of insulin: Secondary | ICD-10-CM | POA: Insufficient documentation

## 2019-12-02 DIAGNOSIS — Y929 Unspecified place or not applicable: Secondary | ICD-10-CM | POA: Insufficient documentation

## 2019-12-02 DIAGNOSIS — X58XXXA Exposure to other specified factors, initial encounter: Secondary | ICD-10-CM | POA: Diagnosis not present

## 2019-12-02 DIAGNOSIS — Z87891 Personal history of nicotine dependence: Secondary | ICD-10-CM | POA: Diagnosis not present

## 2019-12-02 DIAGNOSIS — Y999 Unspecified external cause status: Secondary | ICD-10-CM | POA: Diagnosis not present

## 2019-12-02 DIAGNOSIS — L97519 Non-pressure chronic ulcer of other part of right foot with unspecified severity: Secondary | ICD-10-CM | POA: Diagnosis not present

## 2019-12-02 DIAGNOSIS — S91301A Unspecified open wound, right foot, initial encounter: Secondary | ICD-10-CM | POA: Diagnosis present

## 2019-12-02 DIAGNOSIS — Z7951 Long term (current) use of inhaled steroids: Secondary | ICD-10-CM | POA: Insufficient documentation

## 2019-12-02 LAB — COMPREHENSIVE METABOLIC PANEL
ALT: 17 U/L (ref 0–44)
AST: 19 U/L (ref 15–41)
Albumin: 3.5 g/dL (ref 3.5–5.0)
Alkaline Phosphatase: 77 U/L (ref 38–126)
Anion gap: 10 (ref 5–15)
BUN: 12 mg/dL (ref 8–23)
CO2: 25 mmol/L (ref 22–32)
Calcium: 8.4 mg/dL — ABNORMAL LOW (ref 8.9–10.3)
Chloride: 104 mmol/L (ref 98–111)
Creatinine, Ser: 0.89 mg/dL (ref 0.61–1.24)
GFR calc Af Amer: >60 mL/min (ref >60)
GFR calc non Af Amer: >60 mL/min (ref >60)
Glucose, Bld: 210 mg/dL — ABNORMAL HIGH (ref 70–99)
Potassium: 3.7 mmol/L (ref 3.5–5.1)
Sodium: 139 mmol/L (ref 135–145)
Total Bilirubin: 0.7 mg/dL (ref 0.3–1.2)
Total Protein: 7.1 g/dL (ref 6.5–8.1)

## 2019-12-02 LAB — CBC WITH DIFFERENTIAL/PLATELET
Abs Immature Granulocytes: 0.03 10*3/uL (ref 0.00–0.07)
Basophils Absolute: 0.1 10*3/uL (ref 0.0–0.1)
Basophils Relative: 1 %
Eosinophils Absolute: 0.3 10*3/uL (ref 0.0–0.5)
Eosinophils Relative: 3 %
HCT: 41 % (ref 39.0–52.0)
Hemoglobin: 13 g/dL (ref 13.0–17.0)
Immature Granulocytes: 0 %
Lymphocytes Relative: 19 %
Lymphs Abs: 2.2 10*3/uL (ref 0.7–4.0)
MCH: 27.6 pg (ref 26.0–34.0)
MCHC: 31.7 g/dL (ref 30.0–36.0)
MCV: 87 fL (ref 80.0–100.0)
Monocytes Absolute: 0.7 10*3/uL (ref 0.1–1.0)
Monocytes Relative: 7 %
Neutro Abs: 7.9 10*3/uL — ABNORMAL HIGH (ref 1.7–7.7)
Neutrophils Relative %: 70 %
Platelets: 261 10*3/uL (ref 150–400)
RBC: 4.71 MIL/uL (ref 4.22–5.81)
RDW: 13.2 % (ref 11.5–15.5)
WBC: 11.2 10*3/uL — ABNORMAL HIGH (ref 4.0–10.5)
nRBC: 0 % (ref 0.0–0.2)

## 2019-12-02 NOTE — ED Notes (Signed)
After EDP eval, decision to discharge   Pt wound cleaned and dressed - extra dressing given to pt   Dressing change and wound care discussed, but due to cognitive issues unknown if pt retained info or not   He is given his discharge instructions, agrees that he needs a "foot doctor" and reports he has self referred to a wound place   He is encouraged to return for sx of infection and/or follow with his PCP   He is then discharged

## 2019-12-02 NOTE — ED Notes (Signed)
PA at bedside.
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
## 2019-12-02 NOTE — Discharge Instructions (Addendum)
Please continue taking your antibiotics as directed.  Please call your primary care doctor to schedule a follow-up appointment.  I would also recommend that you look for podiatrists in your area to see if you can establish care with a podiatrist for additional evaluation.  Please make sure you are taking your medications for your diabetes.

## 2019-12-02 NOTE — ED Notes (Signed)
Pt with hx of THI  Seen here for wound to R foot 2 days ago   Given antibiotics   Here for rechack of wound and concern that infection is spreading   Reports he cannot tolerate a covid swab in his nose

## 2019-12-02 NOTE — ED Triage Notes (Signed)
Wound to RT foot x 2weeks

## 2019-12-02 NOTE — ED Notes (Signed)
Awaiting eval  

## 2019-12-05 ENCOUNTER — Emergency Department (HOSPITAL_COMMUNITY)
Admission: EM | Admit: 2019-12-05 | Discharge: 2019-12-05 | Disposition: A | Discharge status: Home / Self Care | Payer: Medicaid Other | Attending: Emergency Medicine | Admitting: Emergency Medicine

## 2019-12-05 ENCOUNTER — Other Ambulatory Visit: Payer: Self-pay

## 2019-12-05 ENCOUNTER — Encounter (HOSPITAL_COMMUNITY): Payer: Self-pay | Admitting: Emergency Medicine

## 2019-12-05 DIAGNOSIS — M79671 Pain in right foot: Secondary | ICD-10-CM | POA: Insufficient documentation

## 2019-12-05 DIAGNOSIS — Z5321 Procedure and treatment not carried out due to patient leaving prior to being seen by health care provider: Secondary | ICD-10-CM | POA: Insufficient documentation

## 2019-12-05 LAB — CBG MONITORING, ED: Glucose-Capillary: 210 mg/dL — ABNORMAL HIGH (ref 70–99)

## 2019-12-05 NOTE — ED Notes (Signed)
Pt called multiple time for treatment room and for blood work no answer in the lobby.

## 2019-12-05 NOTE — ED Triage Notes (Signed)
Pt reports right foot pain. Pt reports was seen for same 2-3 days ago and reports was sent home with abx. Pt reports abx is not helping and reports continued pain. Site malodorous in triage. Site noted to be dressed.

## 2019-12-07 ENCOUNTER — Emergency Department (HOSPITAL_COMMUNITY)
Admission: EM | Admit: 2019-12-07 | Discharge: 2019-12-07 | Discharge status: Against Medical Advice | Payer: Medicaid Other | Attending: Family Medicine | Admitting: Family Medicine

## 2019-12-07 ENCOUNTER — Inpatient Hospital Stay (HOSPITAL_COMMUNITY): Payer: Medicaid Other

## 2019-12-07 ENCOUNTER — Emergency Department (HOSPITAL_COMMUNITY): Payer: Medicaid Other

## 2019-12-07 ENCOUNTER — Encounter (HOSPITAL_COMMUNITY): Payer: Self-pay | Admitting: Emergency Medicine

## 2019-12-07 ENCOUNTER — Other Ambulatory Visit: Payer: Self-pay

## 2019-12-07 ENCOUNTER — Encounter (HOSPITAL_COMMUNITY): Payer: Self-pay

## 2019-12-07 DIAGNOSIS — Z9119 Patient's noncompliance with other medical treatment and regimen: Secondary | ICD-10-CM | POA: Diagnosis not present

## 2019-12-07 DIAGNOSIS — Z6839 Body mass index (BMI) 39.0-39.9, adult: Secondary | ICD-10-CM | POA: Insufficient documentation

## 2019-12-07 DIAGNOSIS — I10 Essential (primary) hypertension: Secondary | ICD-10-CM | POA: Diagnosis not present

## 2019-12-07 DIAGNOSIS — Z794 Long term (current) use of insulin: Secondary | ICD-10-CM | POA: Diagnosis not present

## 2019-12-07 DIAGNOSIS — I872 Venous insufficiency (chronic) (peripheral): Secondary | ICD-10-CM | POA: Insufficient documentation

## 2019-12-07 DIAGNOSIS — R600 Localized edema: Secondary | ICD-10-CM | POA: Diagnosis not present

## 2019-12-07 DIAGNOSIS — Z87891 Personal history of nicotine dependence: Secondary | ICD-10-CM | POA: Insufficient documentation

## 2019-12-07 DIAGNOSIS — E1169 Type 2 diabetes mellitus with other specified complication: Secondary | ICD-10-CM | POA: Diagnosis not present

## 2019-12-07 DIAGNOSIS — M79606 Pain in leg, unspecified: Secondary | ICD-10-CM

## 2019-12-07 DIAGNOSIS — I739 Peripheral vascular disease, unspecified: Secondary | ICD-10-CM | POA: Diagnosis not present

## 2019-12-07 DIAGNOSIS — E1151 Type 2 diabetes mellitus with diabetic peripheral angiopathy without gangrene: Secondary | ICD-10-CM | POA: Diagnosis not present

## 2019-12-07 DIAGNOSIS — I878 Other specified disorders of veins: Secondary | ICD-10-CM | POA: Diagnosis not present

## 2019-12-07 DIAGNOSIS — J449 Chronic obstructive pulmonary disease, unspecified: Secondary | ICD-10-CM | POA: Insufficient documentation

## 2019-12-07 DIAGNOSIS — Z79899 Other long term (current) drug therapy: Secondary | ICD-10-CM | POA: Diagnosis not present

## 2019-12-07 DIAGNOSIS — Z5329 Procedure and treatment not carried out because of patient's decision for other reasons: Secondary | ICD-10-CM | POA: Insufficient documentation

## 2019-12-07 DIAGNOSIS — M869 Osteomyelitis, unspecified: Secondary | ICD-10-CM | POA: Diagnosis not present

## 2019-12-07 DIAGNOSIS — M79671 Pain in right foot: Secondary | ICD-10-CM | POA: Diagnosis present

## 2019-12-07 DIAGNOSIS — Z20822 Contact with and (suspected) exposure to covid-19: Secondary | ICD-10-CM | POA: Insufficient documentation

## 2019-12-07 DIAGNOSIS — M79604 Pain in right leg: Secondary | ICD-10-CM

## 2019-12-07 DIAGNOSIS — E66813 Obesity, class 3: Secondary | ICD-10-CM | POA: Diagnosis present

## 2019-12-07 DIAGNOSIS — M86171 Other acute osteomyelitis, right ankle and foot: Secondary | ICD-10-CM | POA: Insufficient documentation

## 2019-12-07 DIAGNOSIS — Z9114 Patient's other noncompliance with medication regimen: Secondary | ICD-10-CM | POA: Diagnosis not present

## 2019-12-07 LAB — CBC WITH DIFFERENTIAL/PLATELET
Abs Immature Granulocytes: 0.04 10*3/uL (ref 0.00–0.07)
Basophils Absolute: 0.1 10*3/uL (ref 0.0–0.1)
Basophils Relative: 1 %
Eosinophils Absolute: 0.4 10*3/uL (ref 0.0–0.5)
Eosinophils Relative: 3 %
HCT: 39 % (ref 39.0–52.0)
Hemoglobin: 12.5 g/dL — ABNORMAL LOW (ref 13.0–17.0)
Immature Granulocytes: 0 %
Lymphocytes Relative: 12 %
Lymphs Abs: 1.6 10*3/uL (ref 0.7–4.0)
MCH: 28 pg (ref 26.0–34.0)
MCHC: 32.1 g/dL (ref 30.0–36.0)
MCV: 87.2 fL (ref 80.0–100.0)
Monocytes Absolute: 0.9 10*3/uL (ref 0.1–1.0)
Monocytes Relative: 7 %
Neutro Abs: 10.2 10*3/uL — ABNORMAL HIGH (ref 1.7–7.7)
Neutrophils Relative %: 77 %
Platelets: 298 10*3/uL (ref 150–400)
RBC: 4.47 MIL/uL (ref 4.22–5.81)
RDW: 13.2 % (ref 11.5–15.5)
WBC: 13.1 10*3/uL — ABNORMAL HIGH (ref 4.0–10.5)
nRBC: 0 % (ref 0.0–0.2)

## 2019-12-07 LAB — SARS CORONAVIRUS 2 BY RT PCR (HOSPITAL ORDER, PERFORMED IN ~~LOC~~ HOSPITAL LAB): SARS Coronavirus 2: NEGATIVE

## 2019-12-07 LAB — BASIC METABOLIC PANEL
Anion gap: 11 (ref 5–15)
BUN: 9 mg/dL (ref 8–23)
CO2: 26 mmol/L (ref 22–32)
Calcium: 8.2 mg/dL — ABNORMAL LOW (ref 8.9–10.3)
Chloride: 103 mmol/L (ref 98–111)
Creatinine, Ser: 0.78 mg/dL (ref 0.61–1.24)
GFR calc Af Amer: >60 mL/min (ref >60)
GFR calc non Af Amer: >60 mL/min (ref >60)
Glucose, Bld: 180 mg/dL — ABNORMAL HIGH (ref 70–99)
Potassium: 3.1 mmol/L — ABNORMAL LOW (ref 3.5–5.1)
Sodium: 140 mmol/L (ref 135–145)

## 2019-12-07 LAB — C-REACTIVE PROTEIN: CRP: 6.2 mg/dL — ABNORMAL HIGH (ref <1.0)

## 2019-12-07 LAB — CBG MONITORING, ED: Glucose-Capillary: 251 mg/dL — ABNORMAL HIGH (ref 70–99)

## 2019-12-07 LAB — SEDIMENTATION RATE: Sed Rate: 65 mm/hr — ABNORMAL HIGH (ref 0–16)

## 2019-12-07 LAB — HEMOGLOBIN A1C
Hgb A1c MFr Bld: 8.7 % — ABNORMAL HIGH (ref 4.8–5.6)
Mean Plasma Glucose: 202.99 mg/dL

## 2019-12-07 MED ORDER — SODIUM CHLORIDE 0.9 % IV SOLN
2.0000 g | INTRAVENOUS | Status: DC
  Administered 2019-12-07: 2 g via INTRAVENOUS
  Filled 2019-12-07: qty 20

## 2019-12-07 MED ORDER — INSULIN ASPART 100 UNIT/ML ~~LOC~~ SOLN: 3.0000 [IU] | Freq: Three times a day (TID) | SUBCUTANEOUS | Status: DC

## 2019-12-07 MED ORDER — INSULIN ASPART 100 UNIT/ML ~~LOC~~ SOLN: 0.0000 [IU] | Freq: Every day | SUBCUTANEOUS | Status: DC

## 2019-12-07 MED ORDER — INSULIN ASPART 100 UNIT/ML ~~LOC~~ SOLN: 0.0000 [IU] | Freq: Three times a day (TID) | SUBCUTANEOUS | Status: DC

## 2019-12-07 MED ORDER — POTASSIUM CHLORIDE CRYS ER 20 MEQ PO TBCR
40.0000 meq | EXTENDED_RELEASE_TABLET | ORAL | Status: AC
  Administered 2019-12-07 (×2): 40 meq via ORAL
  Filled 2019-12-07 (×2): qty 2

## 2019-12-07 MED ORDER — CIPROFLOXACIN IN D5W 400 MG/200ML IV SOLN
400.0000 mg | Freq: Once | INTRAVENOUS | Status: AC
  Administered 2019-12-07: 400 mg via INTRAVENOUS
  Filled 2019-12-07: qty 200

## 2019-12-07 MED ORDER — VANCOMYCIN HCL IN DEXTROSE 1-5 GM/200ML-% IV SOLN
1000.0000 mg | Freq: Once | INTRAVENOUS | Status: AC
  Administered 2019-12-07: 1000 mg via INTRAVENOUS
  Filled 2019-12-07: qty 200

## 2019-12-07 NOTE — ED Notes (Signed)
Pt does not want to stay to be transported to [facility]. Talked with patient extensively about why Dr. Marisa Severin feels this patient needs to be seen at [facility]. Will finish up antibiotics on patient. After he finished his antibiotics, he would like to leave AMA. He stated he will drive to [facility] himself.

## 2019-12-07 NOTE — ED Triage Notes (Signed)
Patient reports rt foot pain, treated here for the same on 12/05/19 and 12/02/19.
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
## 2019-12-07 NOTE — ED Notes (Signed)
Pt signed out AMA.
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
## 2019-12-07 NOTE — ED Provider Notes (Signed)
This patient is an obese 64 year old male with bilateral lower extremity edema, he has chronic venous stasis and stasis dermatitis, has been diagnosed with possible cellulitis or foot infection secondary to an ulcer on the plantar aspect of his foot on the right side.  On my exam he does not fact have what appears to be an ulcer, there is no purulence or foul smell however he does have some purulent appearing exudative blistering on the lateral aspect of the foot with redness warmth and tenderness of the foot.  He denies fevers and his vital signs appear unremarkable.  He will need an MRI of the foot to evaluate for possible deep tissue infection osteomyelitis or deep tissue abscesses.  Medical screening examination/treatment/procedure(s) were conducted as a shared visit with non-physician practitioner(s) and myself.  I personally evaluated the patient during the encounter.  Clinical Impression:   Final diagnoses:  Other acute osteomyelitis of right foot (HCC)         Eber Hong, MD 12/08/19 [phone]

## 2020-02-16 ENCOUNTER — Other Ambulatory Visit: Payer: Self-pay

## 2020-02-16 ENCOUNTER — Emergency Department (HOSPITAL_COMMUNITY)
Admission: EM | Admit: 2020-02-16 | Discharge: 2020-02-16 | Discharge status: Against Medical Advice | Payer: Medicaid Other

## 2020-04-28 ENCOUNTER — Other Ambulatory Visit: Payer: Self-pay

## 2020-04-28 ENCOUNTER — Emergency Department (HOSPITAL_COMMUNITY)
Admission: EM | Admit: 2020-04-28 | Discharge: 2020-04-28 | Disposition: A | Discharge status: Home / Self Care | Payer: Medicaid Other | Attending: Emergency Medicine | Admitting: Emergency Medicine

## 2020-04-28 ENCOUNTER — Encounter (HOSPITAL_COMMUNITY): Payer: Self-pay | Admitting: Emergency Medicine

## 2020-04-28 DIAGNOSIS — N390 Urinary tract infection, site not specified: Secondary | ICD-10-CM | POA: Diagnosis present

## 2020-04-28 DIAGNOSIS — I10 Essential (primary) hypertension: Secondary | ICD-10-CM | POA: Diagnosis not present

## 2020-04-28 DIAGNOSIS — E119 Type 2 diabetes mellitus without complications: Secondary | ICD-10-CM | POA: Diagnosis not present

## 2020-04-28 DIAGNOSIS — Z794 Long term (current) use of insulin: Secondary | ICD-10-CM | POA: Insufficient documentation

## 2020-04-28 DIAGNOSIS — Z79899 Other long term (current) drug therapy: Secondary | ICD-10-CM | POA: Diagnosis not present

## 2020-04-28 DIAGNOSIS — J449 Chronic obstructive pulmonary disease, unspecified: Secondary | ICD-10-CM | POA: Insufficient documentation

## 2020-04-28 DIAGNOSIS — J45909 Unspecified asthma, uncomplicated: Secondary | ICD-10-CM | POA: Insufficient documentation

## 2020-04-28 DIAGNOSIS — Z87891 Personal history of nicotine dependence: Secondary | ICD-10-CM | POA: Insufficient documentation

## 2020-04-28 DIAGNOSIS — N3000 Acute cystitis without hematuria: Secondary | ICD-10-CM | POA: Insufficient documentation

## 2020-04-28 LAB — URINALYSIS, ROUTINE W REFLEX MICROSCOPIC
Bilirubin Urine: NEGATIVE
Glucose, UA: >=500 mg/dL — AB
Ketones, ur: NEGATIVE mg/dL
Nitrite: NEGATIVE
Protein, ur: 100 mg/dL — AB
RBC / HPF: >50 RBC/hpf — ABNORMAL HIGH (ref 0–5)
Specific Gravity, Urine: 1.026 (ref 1.005–1.030)
WBC, UA: >50 WBC/hpf — ABNORMAL HIGH (ref 0–5)
pH: 5 (ref 5.0–8.0)

## 2020-04-28 MED ORDER — PHENAZOPYRIDINE HCL 100 MG PO TABS: 100.0000 mg | ORAL_TABLET | Freq: Three times a day (TID) | ORAL | 0 refills | Status: AC | PRN

## 2020-04-28 MED ORDER — FOSFOMYCIN TROMETHAMINE 3 G PO PACK
3.0000 g | PACK | Freq: Once | ORAL | Status: AC
  Administered 2020-04-28: 3 g via ORAL
  Filled 2020-04-28: qty 3
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
## 2020-04-28 NOTE — ED Triage Notes (Signed)
Pt reports he had a catheter that had been placed for urinary retention; pt reports catheter was removed x 4 days ago due to pain; reports burning and pain with urination and pain to the end of his penis

## 2020-04-29 LAB — URINE CULTURE

## 2020-10-21 IMAGING — DX DG CHEST 2V
2 series · 2 of 2 positions shown · non-contrast
Comparison: 08/24/2019

CLINICAL DATA: Shortness of breath

EXAM:
CHEST - 2 VIEW

[chest pa]
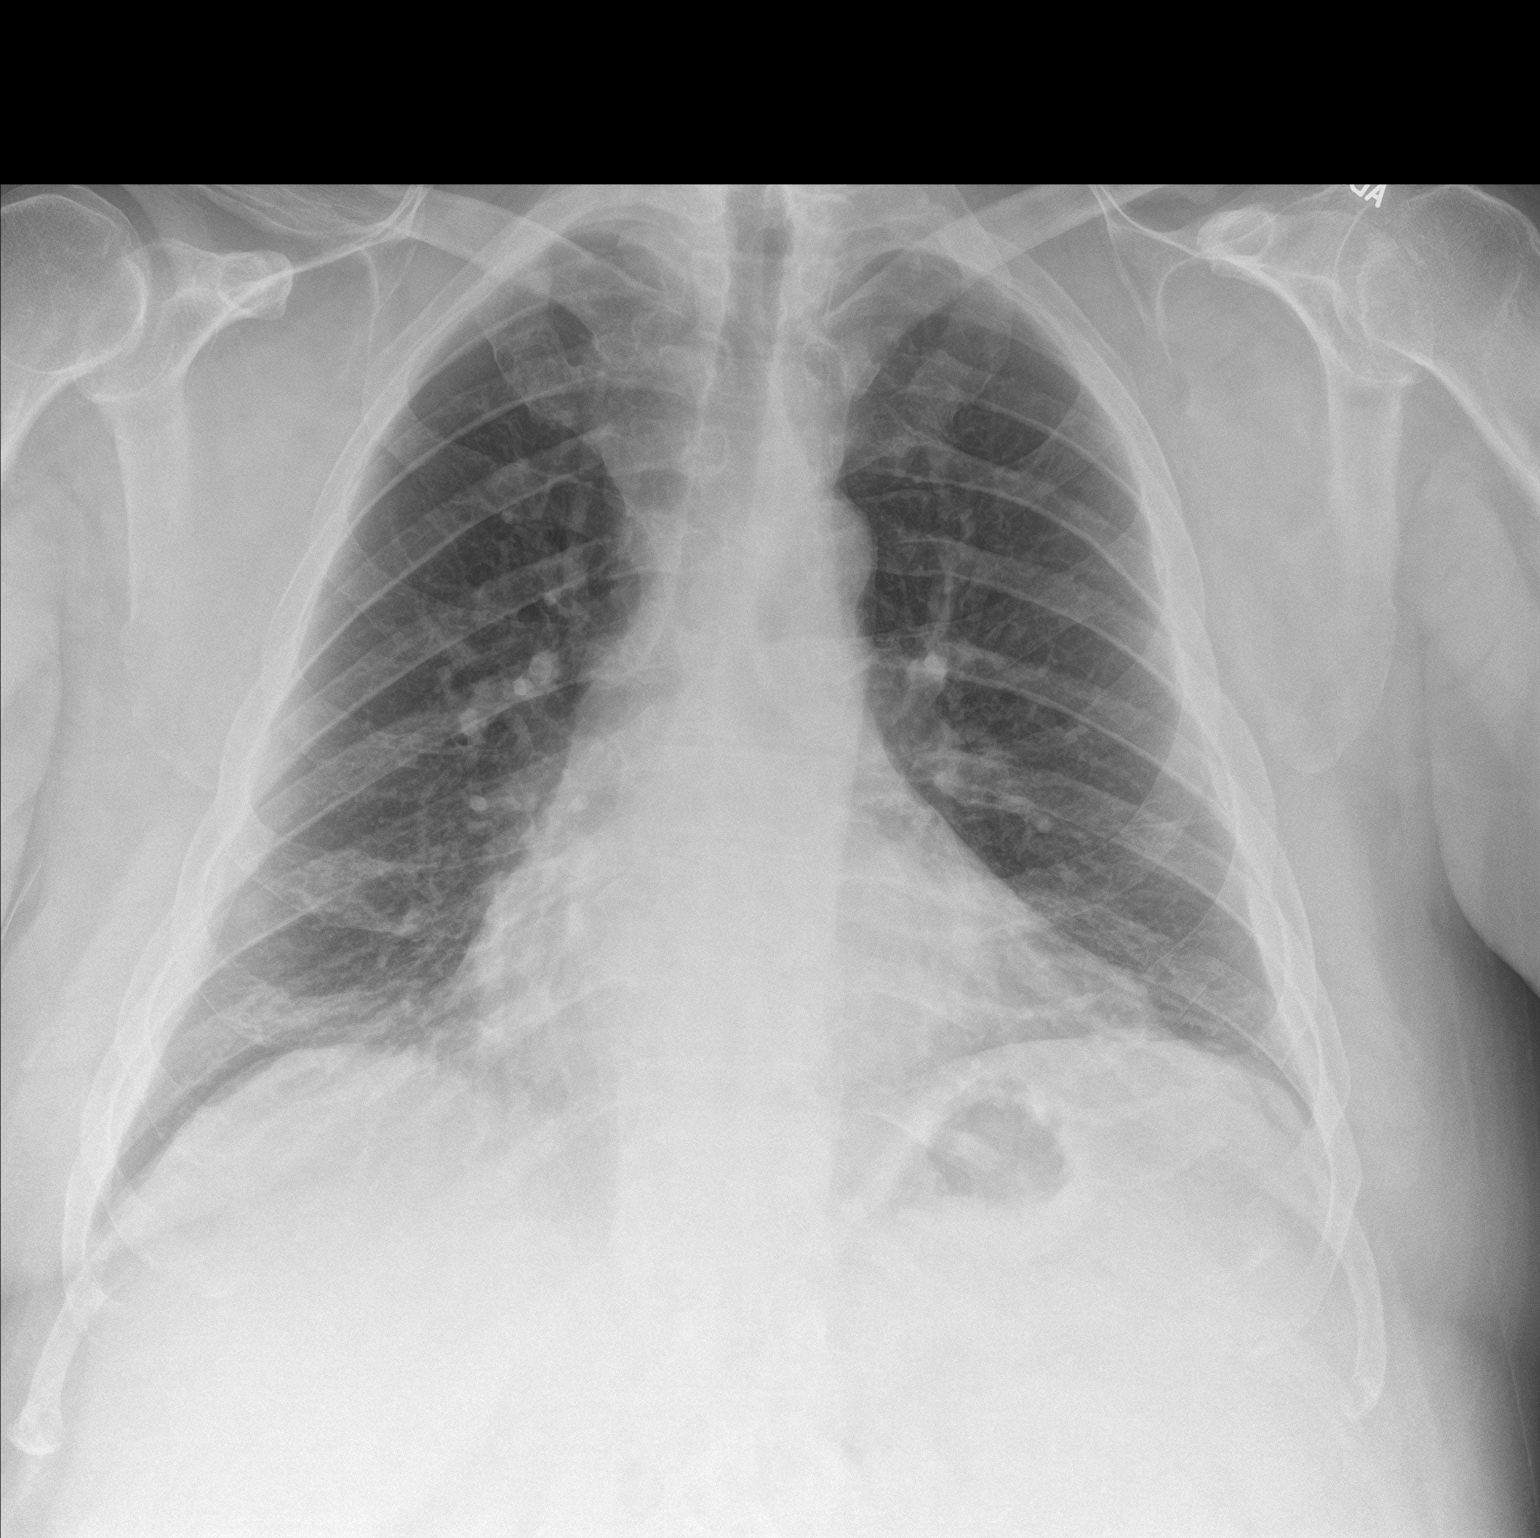

[chest lat]
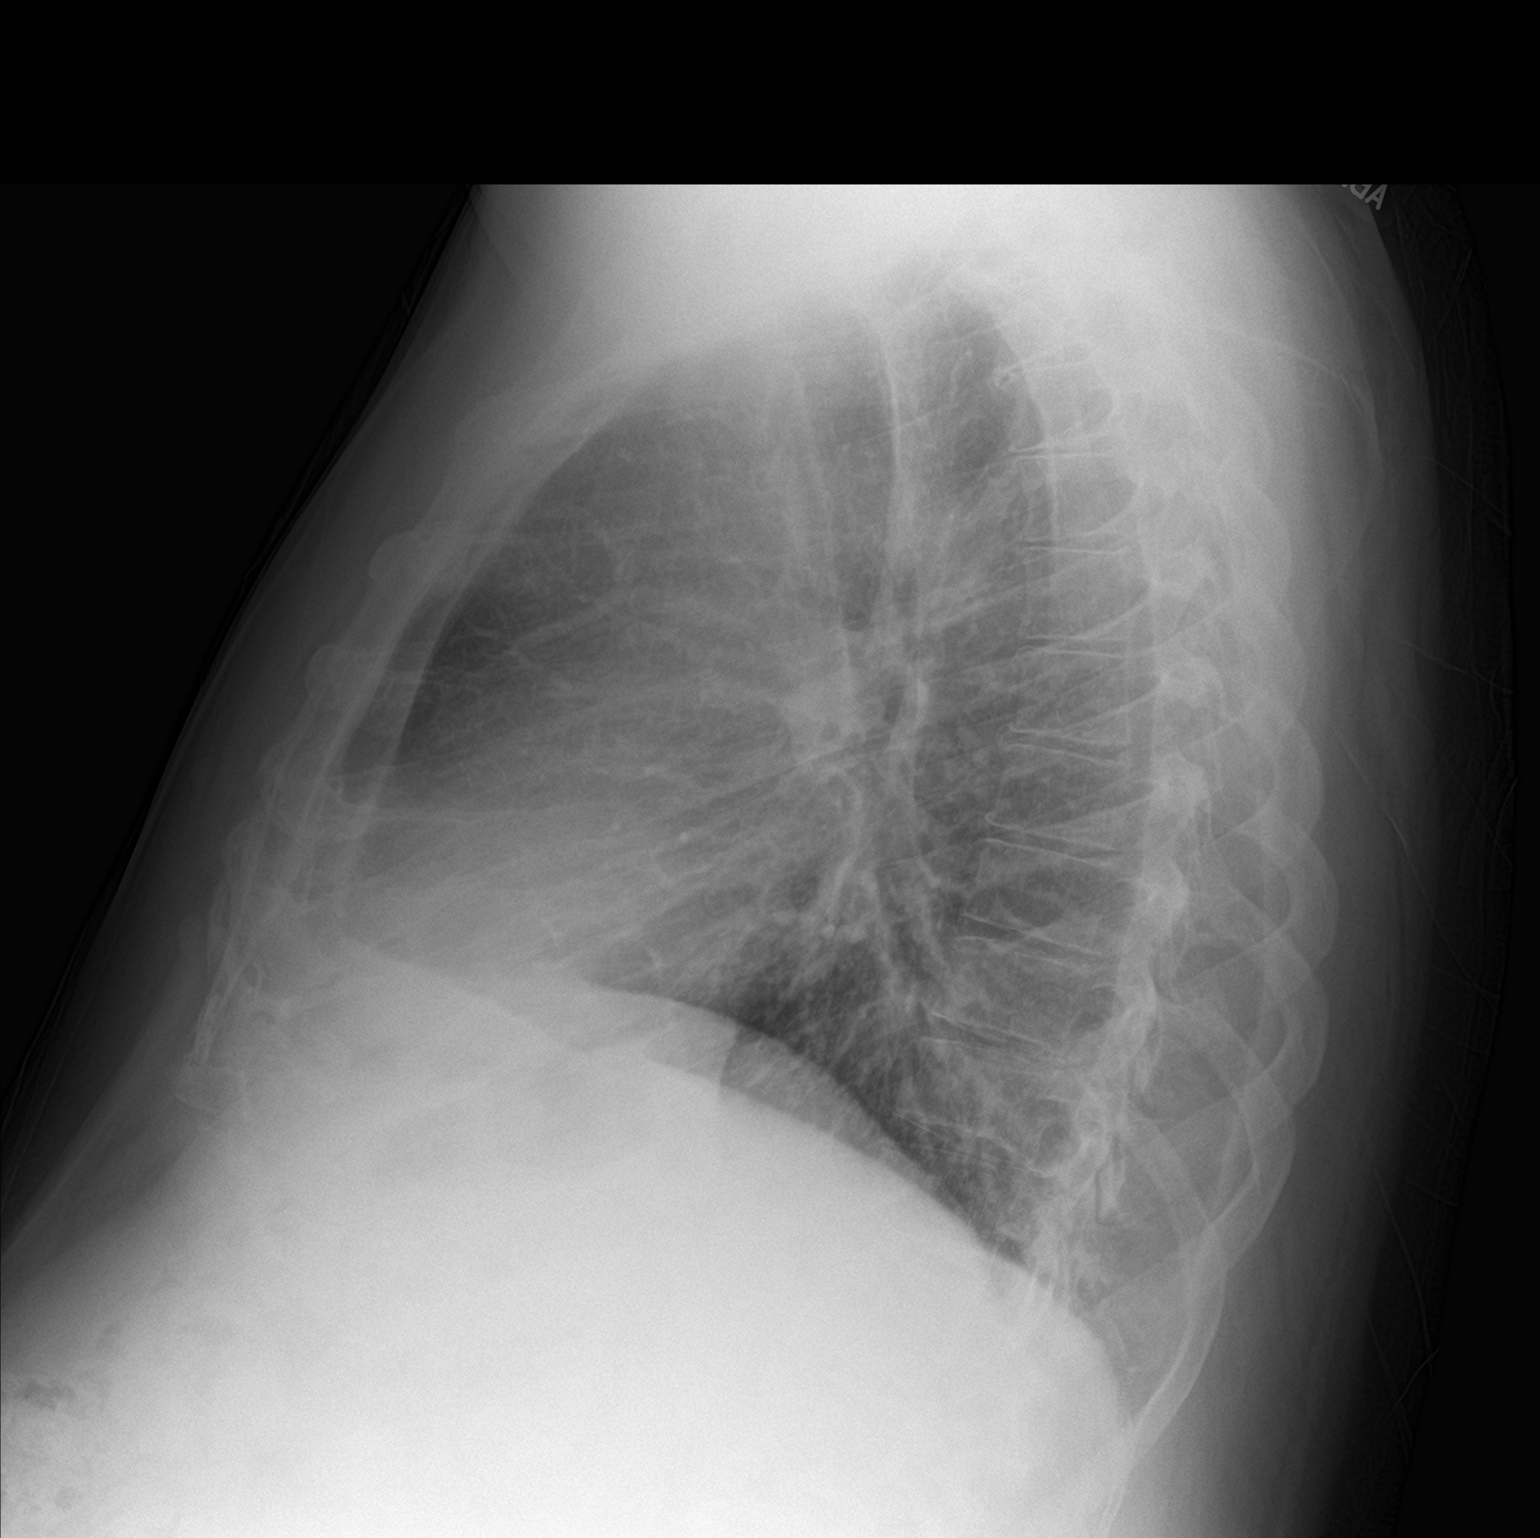

[2 of 2 positions shown; findings below may reference images not displayed]

FINDINGS: Heart and mediastinal contours are within normal limits. No focal
opacities or effusions. No acute bony abnormality.
IMPRESSION: No active cardiopulmonary disease.

## 2021-01-02 IMAGING — DX DG FOOT COMPLETE 3+V*R*
3 series · 3 of 3 positions shown · non-contrast
Comparison: Earlier today

CLINICAL DATA: Diabetic.  Ulcer at on foot.  Pain.

EXAM:
RIGHT FOOT COMPLETE - 3+ VIEW

[foot ap]
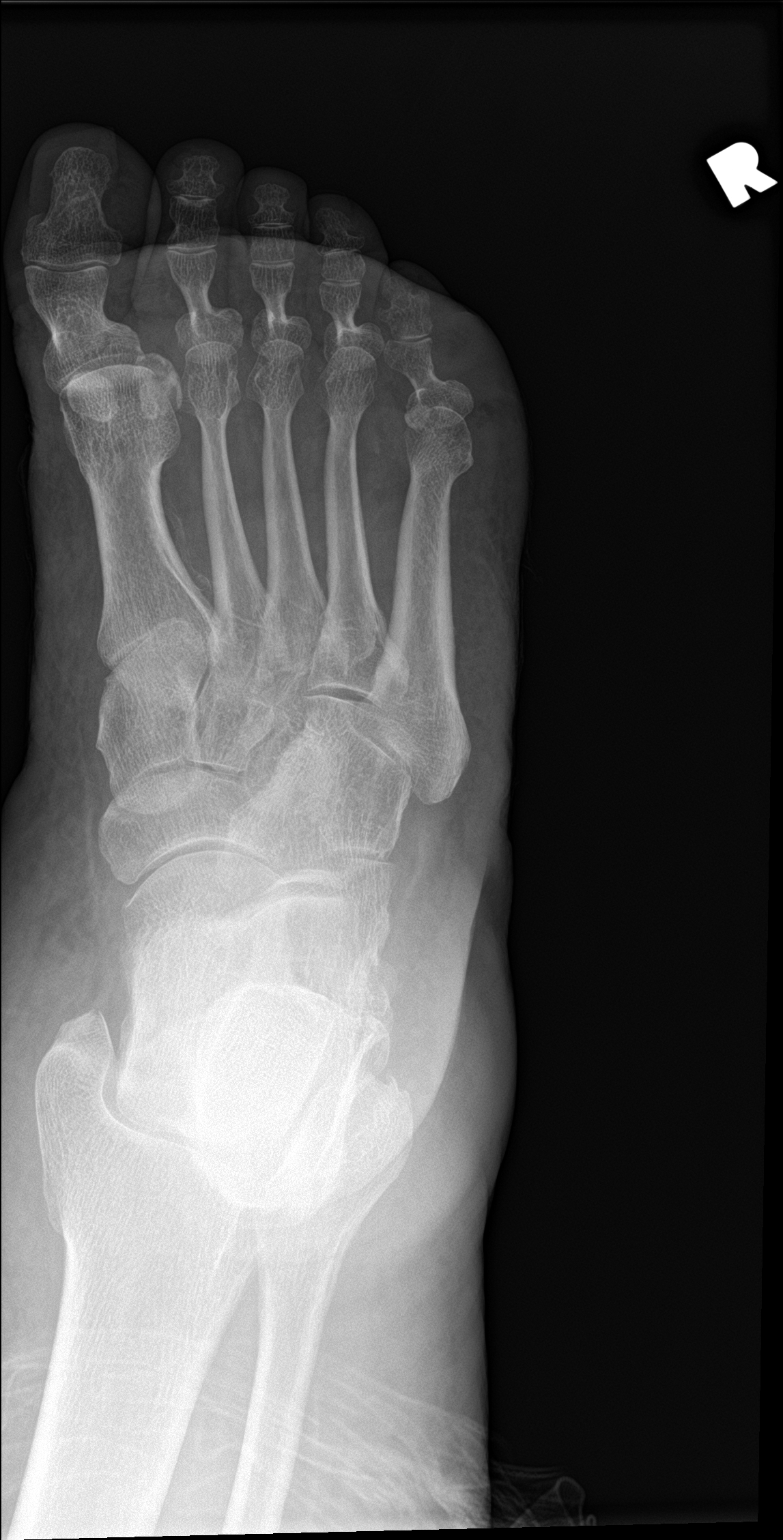

[foot obl]
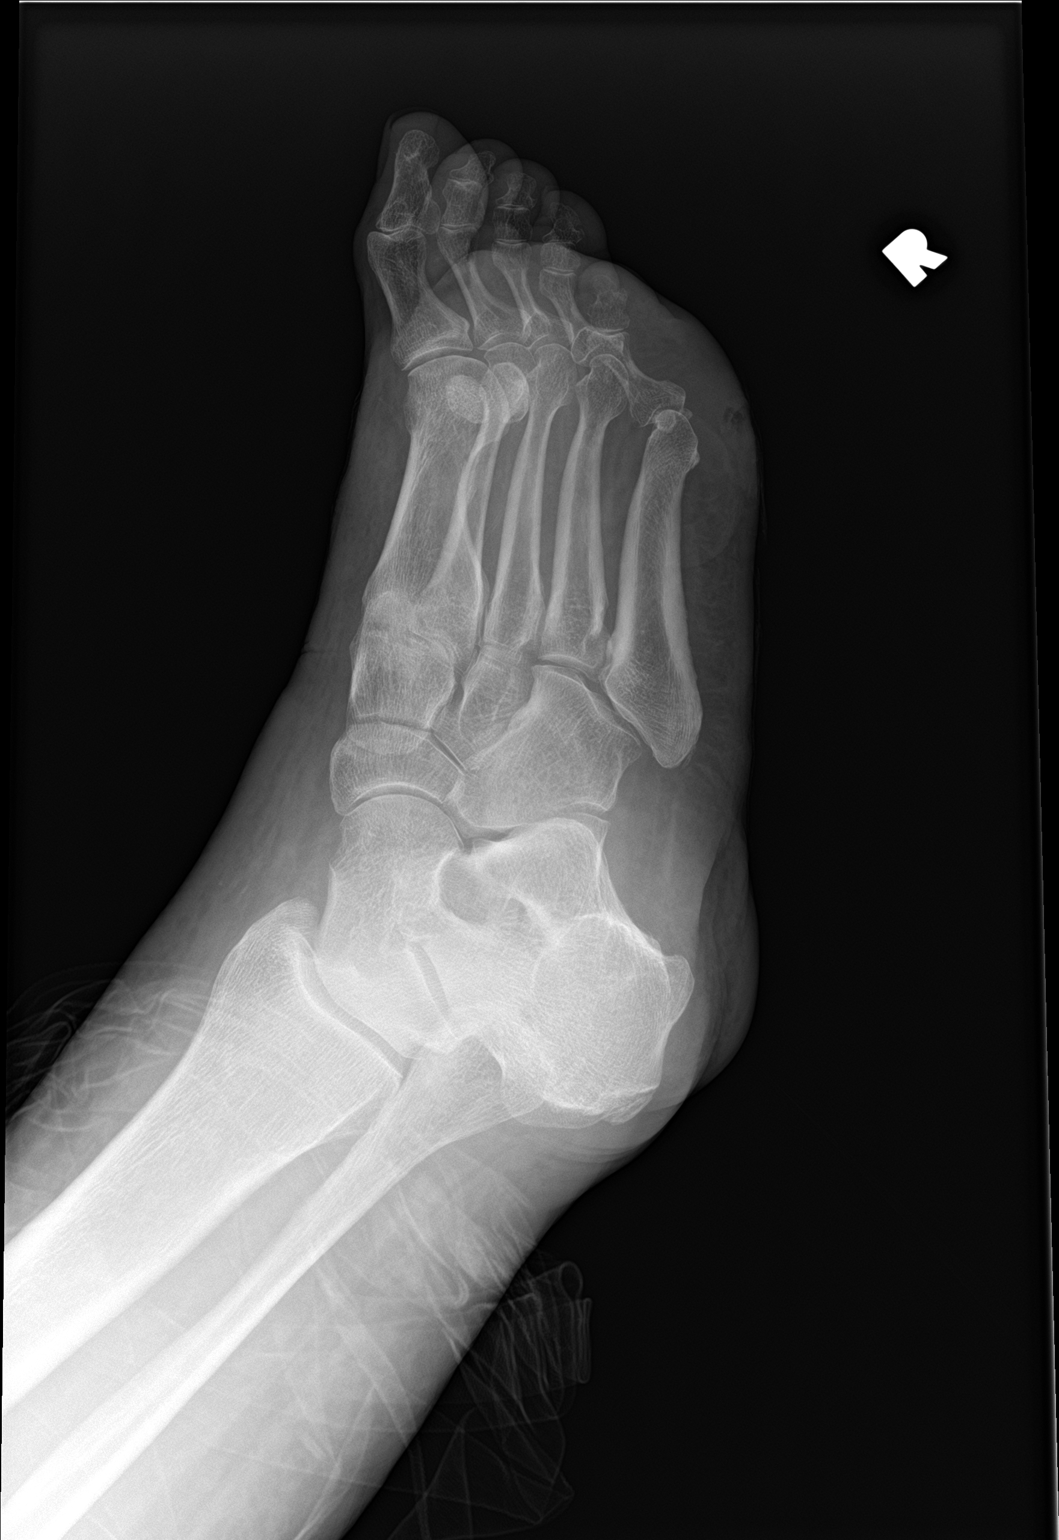

[foot lat]
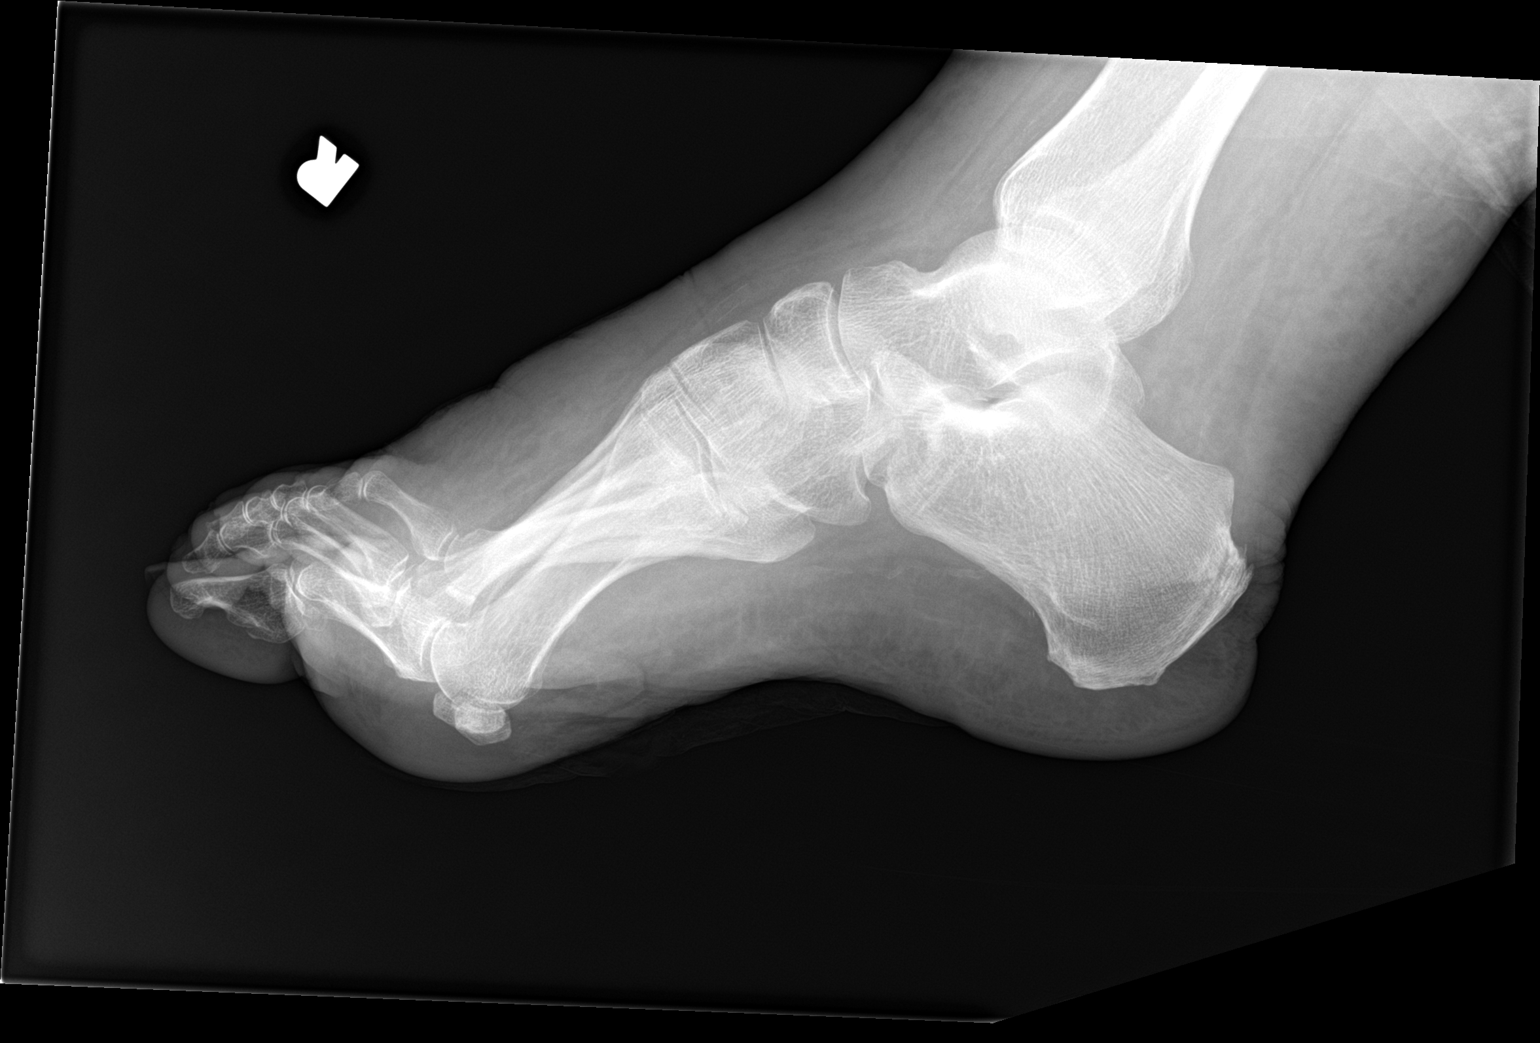

[3 of 3 positions shown; findings below may reference images not displayed]

FINDINGS: No bone destruction seen to suggest osteomyelitis. No fracture,
subluxation or dislocation.
IMPRESSION: No evidence of osteomyelitis or acute bony abnormality.

## 2021-09-10 ENCOUNTER — Emergency Department (HOSPITAL_COMMUNITY): Payer: Medicare (Managed Care)

## 2021-09-10 ENCOUNTER — Encounter (HOSPITAL_COMMUNITY): Payer: Self-pay

## 2021-09-10 ENCOUNTER — Other Ambulatory Visit: Payer: Self-pay

## 2021-09-10 ENCOUNTER — Emergency Department (HOSPITAL_COMMUNITY)
Admission: EM | Admit: 2021-09-10 | Discharge: 2021-09-11 | Discharge status: Against Medical Advice | Payer: Medicare (Managed Care) | Attending: Emergency Medicine | Admitting: Emergency Medicine

## 2021-09-10 DIAGNOSIS — R109 Unspecified abdominal pain: Secondary | ICD-10-CM | POA: Diagnosis present

## 2021-09-10 DIAGNOSIS — I10 Essential (primary) hypertension: Secondary | ICD-10-CM | POA: Diagnosis not present

## 2021-09-10 DIAGNOSIS — Z7984 Long term (current) use of oral hypoglycemic drugs: Secondary | ICD-10-CM | POA: Diagnosis not present

## 2021-09-10 DIAGNOSIS — R739 Hyperglycemia, unspecified: Secondary | ICD-10-CM

## 2021-09-10 DIAGNOSIS — J449 Chronic obstructive pulmonary disease, unspecified: Secondary | ICD-10-CM | POA: Insufficient documentation

## 2021-09-10 DIAGNOSIS — Z79899 Other long term (current) drug therapy: Secondary | ICD-10-CM | POA: Diagnosis not present

## 2021-09-10 DIAGNOSIS — K859 Acute pancreatitis without necrosis or infection, unspecified: Secondary | ICD-10-CM | POA: Diagnosis not present

## 2021-09-10 DIAGNOSIS — E1165 Type 2 diabetes mellitus with hyperglycemia: Secondary | ICD-10-CM | POA: Diagnosis not present

## 2021-09-10 DIAGNOSIS — Z794 Long term (current) use of insulin: Secondary | ICD-10-CM | POA: Diagnosis not present

## 2021-09-10 DIAGNOSIS — R0789 Other chest pain: Secondary | ICD-10-CM | POA: Insufficient documentation

## 2021-09-10 DIAGNOSIS — E860 Dehydration: Secondary | ICD-10-CM | POA: Diagnosis not present

## 2021-09-10 DIAGNOSIS — Z5329 Procedure and treatment not carried out because of patient's decision for other reasons: Secondary | ICD-10-CM | POA: Insufficient documentation

## 2021-09-10 DIAGNOSIS — R748 Abnormal levels of other serum enzymes: Secondary | ICD-10-CM | POA: Diagnosis not present

## 2021-09-10 LAB — CBC
HCT: 41.6 % (ref 39.0–52.0)
Hemoglobin: 13.4 g/dL (ref 13.0–17.0)
MCH: 28.4 pg (ref 26.0–34.0)
MCHC: 32.2 g/dL (ref 30.0–36.0)
MCV: 88.1 fL (ref 80.0–100.0)
Platelets: 237 10*3/uL (ref 150–400)
RBC: 4.72 MIL/uL (ref 4.22–5.81)
RDW: 13.3 % (ref 11.5–15.5)
WBC: 7.9 10*3/uL (ref 4.0–10.5)
nRBC: 0 % (ref 0.0–0.2)

## 2021-09-10 LAB — BASIC METABOLIC PANEL
Anion gap: 10 (ref 5–15)
BUN: 13 mg/dL (ref 8–23)
CO2: 26 mmol/L (ref 22–32)
Calcium: 8.8 mg/dL — ABNORMAL LOW (ref 8.9–10.3)
Chloride: 103 mmol/L (ref 98–111)
Creatinine, Ser: 0.98 mg/dL (ref 0.61–1.24)
GFR, Estimated: >60 mL/min (ref >60)
Glucose, Bld: 547 mg/dL (ref 70–99)
Potassium: 4.2 mmol/L (ref 3.5–5.1)
Sodium: 139 mmol/L (ref 135–145)

## 2021-09-10 LAB — HEPATIC FUNCTION PANEL
ALT: 17 U/L (ref 0–44)
AST: 19 U/L (ref 15–41)
Albumin: 3.9 g/dL (ref 3.5–5.0)
Alkaline Phosphatase: 82 U/L (ref 38–126)
Bilirubin, Direct: 0.1 mg/dL (ref 0.0–0.2)
Indirect Bilirubin: 0.4 mg/dL (ref 0.3–0.9)
Total Bilirubin: 0.5 mg/dL (ref 0.3–1.2)
Total Protein: 7.5 g/dL (ref 6.5–8.1)

## 2021-09-10 LAB — LIPASE, BLOOD: Lipase: 91 U/L — ABNORMAL HIGH (ref 11–51)

## 2021-09-10 MED ORDER — OXYCODONE-ACETAMINOPHEN 5-325 MG PO TABS
1.0000 | ORAL_TABLET | Freq: Once | ORAL | Status: AC
  Administered 2021-09-10: 1 via ORAL
  Filled 2021-09-10: qty 1

## 2021-09-10 MED ORDER — SODIUM CHLORIDE 0.9 % IV BOLUS
1000.0000 mL | Freq: Once | INTRAVENOUS | Status: AC
Start: 2021-09-10 — End: 2021-09-11
  Administered 2021-09-10: 1000 mL via INTRAVENOUS

## 2021-09-10 MED ORDER — INSULIN ASPART 100 UNIT/ML IJ SOLN
20.0000 [IU] | Freq: Once | INTRAMUSCULAR | Status: AC
  Administered 2021-09-10: 20 [IU] via SUBCUTANEOUS
  Filled 2021-09-10: qty 1

## 2021-09-10 NOTE — ED Triage Notes (Signed)
Pt c/o right flank pain that started two days ago. Denies any urinary symptoms.  ?

## 2021-09-10 NOTE — ED Provider Notes (Signed)
?[facility] EMERGENCY DEPARTMENT ?Provider Note ? ? ?CSN: [id number] ?Arrival date & time: 09/10/21  2113 ? ?  ? ?History ? ?Chief Complaint  ?Patient presents with  ? Flank Pain  ? ? ?Julian Richards is a 66 y.o. male. ? ?Patient has a history of diabetes hypertension COPD.  He presents initially with right flank pain then he presented saying the pain was more right lower chest. ? ?The history is provided by the patient and medical records. No language interpreter was used.  ?Flank Pain ?This is a new problem. The problem occurs constantly. The problem has not changed since onset.Associated symptoms include chest pain. Pertinent negatives include no abdominal pain and no headaches. Nothing aggravates the symptoms. Nothing relieves the symptoms. He has tried nothing for the symptoms. The treatment provided no relief.  ? ?  ? ?Home Medications ?Prior to Admission medications   ?Medication Sig Start Date End Date Taking? Authorizing Provider  ?albuterol (VENTOLIN HFA) 108 (90 Base) MCG/ACT inhaler Inhale 2 puffs into the lungs every 2 (two) hours as needed for wheezing or shortness of breath (cough). 09/17/19   Gilda CreasePollina, Christopher J, MD  ?atorvastatin (LIPITOR) 80 MG tablet Take 80 mg by mouth daily. 07/15/19   [provider]  ?budesonide-formoterol (SYMBICORT) 80-4.5 MCG/ACT inhaler Inhale 2 puffs into the lungs 2 (two) times daily. ?Patient not taking: Reported on 12/07/2019 07/01/19   Burgess AmorIdol, Julie, PA-C  ?cetirizine (ZYRTEC) 10 MG tablet Take 10 mg by mouth daily. 07/15/19   [provider]  ?clindamycin (CLEOCIN) 150 MG capsule Take 1 capsule (150 mg total) by mouth every 6 (six) hours. ?Patient taking differently: Take 300 mg by mouth 2 (two) times daily.  11/06/19   Dione BoozeGlick, David, MD  ?colchicine 0.6 MG tablet Take 0.6 mg by mouth daily. 10/14/19   [provider]  ?FIBERCON 625 MG tablet Take 625 mg by mouth daily. 07/27/19   [provider]  ?furosemide (LASIX) 40 MG tablet Take 40 mg by  mouth daily.  07/15/19   [provider]  ?glimepiride (AMARYL) 4 MG tablet Take 4 mg by mouth daily. 08/06/19   [provider]  ?insulin aspart (NOVOLOG) 100 UNIT/ML injection Inject 75 Units into the skin once.  08/01/19   [provider]  ?JARDIANCE 25 MG TABS tablet Take 25 mg by mouth daily. 07/05/19   [provider]  ?LANTUS 100 UNIT/ML injection Inject 75 Units into the skin at bedtime.  08/01/19   [provider]  ?lisinopril (ZESTRIL) 10 MG tablet Take 1 tablet (10 mg total) by mouth daily. 05/24/19   Burgess AmorIdol, Julie, PA-C  ?mupirocin ointment (BACTROBAN) 2 % 1 application daily as needed.    [provider]  ?pantoprazole (PROTONIX) 40 MG tablet Take 1 tablet (40 mg total) by mouth 2 (two) times daily before a meal. ?Patient not taking: Reported on 12/07/2019 08/19/19   Gilda CreasePollina, Christopher J, MD  ?phenazopyridine (PYRIDIUM) 100 MG tablet Take 1 tablet (100 mg total) by mouth 3 (three) times daily as needed for pain. 04/28/20   Gilda CreasePollina, Christopher J, MD  ?sitaGLIPtin (JANUVIA) 100 MG tablet Take 1 tablet (100 mg total) by mouth daily. ?Patient not taking: Reported on 12/07/2019 05/29/19   Dione BoozeGlick, David, MD  ?traMADol (ULTRAM) 50 MG tablet Take 50 mg by mouth 4 (four) times daily as needed. 12/07/19   [provider]  ?Monte FantasiaWIXELA INHUB 250-50 MCG/DOSE AEPB Inhale 1 puff into the lungs 2 (two) times daily. ?Patient not taking: Reported  on 12/07/2019 05/21/19   [provider]  ?   ? ?Allergies    ?Keflex [cephalexin]   ? ?Review of Systems   ?Review of Systems  ?Constitutional:  Negative for appetite change and fatigue.  ?HENT:  Negative for congestion, ear discharge and sinus pressure.   ?Eyes:  Negative for discharge.  ?Respiratory:  Negative for cough.   ?Cardiovascular:  Positive for chest pain.  ?Gastrointestinal:  Negative for abdominal pain and diarrhea.  ?Genitourinary:  Positive for flank pain. Negative for frequency and hematuria.  ?Musculoskeletal:   Negative for back pain.  ?Skin:  Negative for rash.  ?Neurological:  Negative for seizures and headaches.  ?Psychiatric/Behavioral:  Negative for hallucinations.   ? ?Physical Exam ?Updated Vital Signs ?BP (!) 162/68 (BP Location: Right Arm)   Pulse 78   Temp 97.8 ?F (36.6 ?C) (Oral)   Resp 19   Ht 5\' 9"  (1.753 m)   Wt 115.7 kg   SpO2 97%   BMI 37.66 kg/m?  ?Physical Exam ?Vitals reviewed.  ?Constitutional:   ?   Appearance: He is well-developed.  ?HENT:  ?   Head: Normocephalic.  ?   Nose: Nose normal.  ?Eyes:  ?   General: No scleral icterus. ?   Conjunctiva/sclera: Conjunctivae normal.  ?Neck:  ?   Thyroid: No thyromegaly.  ?Cardiovascular:  ?   Rate and Rhythm: Normal rate and regular rhythm.  ?   Heart sounds: No murmur heard. ?  No friction rub. No gallop.  ?Pulmonary:  ?   Breath sounds: No stridor. No wheezing or rales.  ?Chest:  ?   Chest wall: Tenderness present.  ?Abdominal:  ?   General: There is no distension.  ?   Tenderness: There is no abdominal tenderness. There is no rebound.  ?Genitourinary: ?   Comments: Flank pain tender right ?Musculoskeletal:     ?   General: Normal range of motion.  ?   Cervical back: Neck supple.  ?Lymphadenopathy:  ?   Cervical: No cervical adenopathy.  ?Skin: ?   Findings: No erythema or rash.  ?Neurological:  ?   Mental Status: He is alert and oriented to person, place, and time.  ?   Motor: No abnormal muscle tone.  ?   Coordination: Coordination normal.  ?Psychiatric:     ?   Behavior: Behavior normal.  ? ? ?ED Results / Procedures / Treatments   ?Labs ?(all labs ordered are listed, but only abnormal results are displayed) ?Labs Reviewed  ?BASIC METABOLIC PANEL - Abnormal; Notable for the following components:  ?    Result Value  ? Glucose, Bld 547 (*)   ? Calcium 8.8 (*)   ? All other components within normal limits  ?LIPASE, BLOOD - Abnormal; Notable for the following components:  ? Lipase 91 (*)   ? All other components within normal limits  ?CBC  ?HEPATIC  FUNCTION PANEL  ?URINALYSIS, ROUTINE W REFLEX MICROSCOPIC  ?TROPONIN I (HIGH SENSITIVITY)  ? ? ?EKG ?None ? ?Radiology ?DG Chest Port 1 View ? ?Result Date: 09/10/2021 ?CLINICAL DATA:  Pain.  Right flank and chest pain for 2 days. EXAM: PORTABLE CHEST 1 VIEW COMPARISON:  Chest radiograph 01/12/2021 FINDINGS: Stable upper normal heart size. Unchanged mediastinal contours. No confluent consolidation. No pulmonary edema. No pleural effusion or pneumothorax. No acute osseous abnormalities are seen. IMPRESSION: No acute chest finding. Electronically Signed   By: 01/14/2021 M.D.   On: 09/10/2021 22:55   ? ?Procedures ?Procedures  ? ? ?  Medications Ordered in ED ?Medications  ?oxyCODONE-acetaminophen (PERCOCET/ROXICET) 5-325 MG per tablet 1 tablet (1 tablet Oral Given 09/10/21 2228)  ?sodium chloride 0.9 % bolus 1,000 mL (1,000 mLs Intravenous New Bag/Given 09/10/21 2304)  ?insulin aspart (novoLOG) injection 20 Units (20 Units Subcutaneous Given 09/10/21 2304)  ? ? ?ED Course/ Medical Decision Making/ A&P ?  ?                        ?Medical Decision Making ?Amount and/or Complexity of Data Reviewed ?Labs: ordered. ?Radiology: ordered. ?ECG/medicine tests: ordered. ? ?Risk ?Prescription drug management. ? ?This patient presents to the ED for concern of flank pain and chest, this involves an extensive number of treatment options, and is a complaint that carries with it a high risk of complications and morbidity.  The differential diagnosis includes MI kidney stone ? ? ?Co morbidities that complicate the patient evaluation ? ?Diabetes hypertension COPD ? ? ?Additional history obtained: ? ?Additional history obtained from patient ?External records from outside source obtained and reviewed including hospital record ? ? ?Lab Tests: ? ?I Ordered, and personally interpreted labs.  The pertinent results include: CBC and chemistry showed glucose of 547 ? ? ?Imaging Studies ordered: ? ?I ordered imaging studies including CT  abdomen ?I independently visualized and interpreted imaging which showed pancreatitis ?I agree with the radiologist interpretation ? ? ?Cardiac Monitoring: / EKG: ? ?The patient was maintained on a cardiac monitor.

## 2021-09-10 NOTE — ED Provider Notes (Signed)
Care assumed from Dr. Estell Harpin, patient with right flank pain of uncertain cause, mildly elevated lipase pending CT of abdomen and pelvis, ECG, troponins.  Also noted to be hyperglycemic and getting IV fluids and insulin and will need to follow glucose. ? ?CT showed some inflammation around the pancreas consistent with focal pancreatitis.  I have independently viewed the images, and agree with radiologist's interpretation.  Patient apparently left the emergency department without telling anybody before I could discuss findings with him, before improvement in glucose could be documented. ? ?Results for orders placed or performed during the hospital encounter of 09/10/21  ?Basic metabolic panel  ?Result Value Ref Range  ? Sodium 139 135 - 145 mmol/L  ? Potassium 4.2 3.5 - 5.1 mmol/L  ? Chloride 103 98 - 111 mmol/L  ? CO2 26 22 - 32 mmol/L  ? Glucose, Bld 547 (HH) 70 - 99 mg/dL  ? BUN 13 8 - 23 mg/dL  ? Creatinine, Ser 0.98 0.61 - 1.24 mg/dL  ? Calcium 8.8 (L) 8.9 - 10.3 mg/dL  ? GFR, Estimated >60 >60 mL/min  ? Anion gap 10 5 - 15  ?CBC  ?Result Value Ref Range  ? WBC 7.9 4.0 - 10.5 K/uL  ? RBC 4.72 4.22 - 5.81 MIL/uL  ? Hemoglobin 13.4 13.0 - 17.0 g/dL  ? HCT 41.6 39.0 - 52.0 %  ? MCV 88.1 80.0 - 100.0 fL  ? MCH 28.4 26.0 - 34.0 pg  ? MCHC 32.2 30.0 - 36.0 g/dL  ? RDW 13.3 11.5 - 15.5 %  ? Platelets 237 150 - 400 K/uL  ? nRBC 0.0 0.0 - 0.2 %  ?Hepatic function panel  ?Result Value Ref Range  ? Total Protein 7.5 6.5 - 8.1 g/dL  ? Albumin 3.9 3.5 - 5.0 g/dL  ? AST 19 15 - 41 U/L  ? ALT 17 0 - 44 U/L  ? Alkaline Phosphatase 82 38 - 126 U/L  ? Total Bilirubin 0.5 0.3 - 1.2 mg/dL  ? Bilirubin, Direct 0.1 0.0 - 0.2 mg/dL  ? Indirect Bilirubin 0.4 0.3 - 0.9 mg/dL  ?Lipase, blood  ?Result Value Ref Range  ? Lipase 91 (H) 11 - 51 U/L  ?Troponin I (High Sensitivity)  ?Result Value Ref Range  ? Troponin I (High Sensitivity) 4 <18 ng/L  ?Troponin I (High Sensitivity)  ?Result Value Ref Range  ? Troponin I (High Sensitivity) 3 <18  ng/L  ? ?DG Chest Port 1 View ? ?Result Date: 09/10/2021 ?CLINICAL DATA:  Pain.  Right flank and chest pain for 2 days. EXAM: PORTABLE CHEST 1 VIEW COMPARISON:  Chest radiograph 01/12/2021 FINDINGS: Stable upper normal heart size. Unchanged mediastinal contours. No confluent consolidation. No pulmonary edema. No pleural effusion or pneumothorax. No acute osseous abnormalities are seen. IMPRESSION: No acute chest finding. Electronically Signed   By: Narda Rutherford M.D.   On: 09/10/2021 22:55  ? ?CT Renal Stone Study ? ?Result Date: 09/10/2021 ?CLINICAL DATA:  Right-sided flank pain for 2 days EXAM: CT ABDOMEN AND PELVIS WITHOUT CONTRAST TECHNIQUE: Multidetector CT imaging of the abdomen and pelvis was performed following the standard protocol without IV contrast. RADIATION DOSE REDUCTION: This exam was performed according to the departmental dose-optimization program which includes automated exposure control, adjustment of the mA and/or kV according to patient size and/or use of iterative reconstruction technique. COMPARISON:  None. FINDINGS: Lower chest: No acute abnormality. Hepatobiliary: No focal liver abnormality is seen. No gallstones, gallbladder wall thickening, or biliary dilatation. Pancreas: Pancreas is  well visualized demonstrates some very mild inflammatory changes surrounding the head and uncinate process consistent with early pancreatitis. Spleen: Normal in size without focal abnormality. Adrenals/Urinary Tract: Adrenal glands are within normal limits. Kidneys are well visualized bilaterally. No renal calculi are seen. Hydronephrosis and hydroureter is noted bilaterally likely related to the over distended urinary bladder. Stomach/Bowel: The appendix is within normal limits. No obstructive or inflammatory changes of the colon are seen. Small bowel and stomach appear within normal limits. Vascular/Lymphatic: Aortic atherosclerosis. No enlarged abdominal or pelvic lymph nodes. Reproductive: Prostate is  unremarkable. Other: No abdominal wall hernia or abnormality. No abdominopelvic ascites. Musculoskeletal: Degenerative changes of lumbar spine are noted. IMPRESSION: Over distended urinary bladder with subsequent bilateral hydronephrosis and hydroureter. No stones are seen. This will likely resolve with decompression of the bladder. Mild inflammatory changes surrounding the head and uncinate process of the pancreas consistent with focal pancreatitis. No pseudocyst or significant phlegmon is noted. No other focal abnormality is seen. Electronically Signed   By: Alcide Clever M.D.   On: 09/10/2021 23:42   ? ?  ?Dione Booze, MD ?09/11/21 [phone] ? ?

## 2021-09-10 NOTE — ED Notes (Signed)
Patient transported to CT 

## 2021-09-11 DIAGNOSIS — K859 Acute pancreatitis without necrosis or infection, unspecified: Secondary | ICD-10-CM | POA: Diagnosis not present

## 2021-09-11 LAB — TROPONIN I (HIGH SENSITIVITY)
Troponin I (High Sensitivity): 4 ng/L (ref <18)
Troponin I (High Sensitivity): 3 ng/L (ref <18)

## 2021-09-11 NOTE — ED Notes (Signed)
Pt requesting this RN to take IV out and to disconnect him from all monitoring equipment. Pt is requesting to leave AMA but refuses to sign. This RN explained the risks of leaving AMA. Dr. Preston Fleeting made aware.   ?

## 2022-07-02 DEATH — deceased
